# Patient Record
Sex: Female | Born: 1970
Health system: Southern US, Community
[De-identification: ages and names within clinical notes are randomized; demographics above are authoritative.]

## PROBLEM LIST (undated history)

## (undated) DIAGNOSIS — T7840XA Allergy, unspecified, initial encounter: Secondary | ICD-10-CM

## (undated) DIAGNOSIS — E039 Hypothyroidism, unspecified: Secondary | ICD-10-CM

## (undated) HISTORY — PX: NO PAST SURGERIES: SHX2092

## (undated) HISTORY — DX: Allergy, unspecified, initial encounter: T78.40XA

---

## 2015-04-16 ENCOUNTER — Other Ambulatory Visit: Payer: Self-pay | Admitting: Obstetrics and Gynecology

## 2015-04-16 DIAGNOSIS — R928 Other abnormal and inconclusive findings on diagnostic imaging of breast: Secondary | ICD-10-CM

## 2015-04-21 ENCOUNTER — Ambulatory Visit
Admission: RE | Admit: 2015-04-21 | Discharge: 2015-04-21 | Disposition: A | Discharge status: Home / Self Care | Payer: BLUE CROSS/BLUE SHIELD | Source: Ambulatory Visit | Attending: Obstetrics and Gynecology | Admitting: Obstetrics and Gynecology

## 2015-04-21 DIAGNOSIS — R928 Other abnormal and inconclusive findings on diagnostic imaging of breast: Secondary | ICD-10-CM

## 2016-04-27 ENCOUNTER — Other Ambulatory Visit: Payer: Self-pay | Admitting: Obstetrics and Gynecology

## 2016-04-27 DIAGNOSIS — R928 Other abnormal and inconclusive findings on diagnostic imaging of breast: Secondary | ICD-10-CM

## 2016-05-06 ENCOUNTER — Ambulatory Visit
Admission: RE | Admit: 2016-05-06 | Discharge: 2016-05-06 | Disposition: A | Discharge status: Home / Self Care | Payer: BLUE CROSS/BLUE SHIELD | Source: Ambulatory Visit | Attending: Obstetrics and Gynecology | Admitting: Obstetrics and Gynecology

## 2016-05-06 DIAGNOSIS — R928 Other abnormal and inconclusive findings on diagnostic imaging of breast: Secondary | ICD-10-CM

## 2016-11-04 ENCOUNTER — Other Ambulatory Visit: Payer: Self-pay | Admitting: Obstetrics and Gynecology

## 2016-11-04 DIAGNOSIS — N6489 Other specified disorders of breast: Secondary | ICD-10-CM

## 2016-11-11 ENCOUNTER — Ambulatory Visit
Admission: RE | Admit: 2016-11-11 | Discharge: 2016-11-11 | Disposition: A | Discharge status: Home / Self Care | Payer: No Typology Code available for payment source | Source: Ambulatory Visit | Attending: Obstetrics and Gynecology | Admitting: Obstetrics and Gynecology

## 2016-11-11 DIAGNOSIS — N6489 Other specified disorders of breast: Secondary | ICD-10-CM

## 2017-12-22 ENCOUNTER — Other Ambulatory Visit: Payer: Self-pay | Admitting: Obstetrics and Gynecology

## 2017-12-22 DIAGNOSIS — N6489 Other specified disorders of breast: Secondary | ICD-10-CM

## 2018-01-09 ENCOUNTER — Ambulatory Visit
Admission: RE | Admit: 2018-01-09 | Discharge: 2018-01-09 | Disposition: A | Discharge status: Home / Self Care | Payer: No Typology Code available for payment source | Source: Ambulatory Visit | Attending: Obstetrics and Gynecology | Admitting: Obstetrics and Gynecology

## 2018-01-09 DIAGNOSIS — N6489 Other specified disorders of breast: Secondary | ICD-10-CM

## 2019-07-18 ENCOUNTER — Telehealth: Payer: Self-pay | Admitting: Pharmacy Technician

## 2019-07-18 ENCOUNTER — Other Ambulatory Visit: Payer: Self-pay

## 2019-07-18 ENCOUNTER — Ambulatory Visit (INDEPENDENT_AMBULATORY_CARE_PROVIDER_SITE_OTHER): Payer: Self-pay | Admitting: *Deleted

## 2019-07-18 ENCOUNTER — Other Ambulatory Visit: Payer: Self-pay | Admitting: Pharmacist

## 2019-07-18 DIAGNOSIS — Z23 Encounter for immunization: Secondary | ICD-10-CM

## 2019-07-18 DIAGNOSIS — Z7251 High risk heterosexual behavior: Secondary | ICD-10-CM

## 2019-07-18 MED ORDER — EMTRICITABINE-TENOFOVIR DF 200-300 MG PO TABS: 1.0000 | ORAL_TABLET | Freq: Every day | ORAL | 2 refills | Status: DC

## 2019-07-18 MED FILL — TRUVADA 200-300 MG TABS: 200-300 | 30 days supply | Qty: 30 | Fill #0

## 2019-07-18 NOTE — Telephone Encounter (Addendum)
[facility] Patient Advocate Encounter   Patient has been approved for [employer] Advancing Access Patient Assistance Program for Truvada for 30days. This assistance will make the patient's copay $0.  I have spoken with the patient and they will pick up at [facility] after today's appointment.  The billing information is as follows:  Member ID: [id number] [place]: [id number] PCN: [id number] Group: [id number]  Patient knows to call the office with questions or concerns.  Bartholomew Crews, CPhT Specialty Pharmacy Patient [facility] Phone: [phone] Fax: [phone] 07/18/2019 3:56 PM

## 2019-07-18 NOTE — Progress Notes (Signed)
Patient came in with husband who is newly diagnosed HIV positive.  She will start on Truvada for PrEP and see me back in 1 month. Uninsured so [name] approval was obtained and she will pick up at [facility].

## 2019-07-19 NOTE — Telephone Encounter (Signed)
Patient has been approved to receive her medication at no charge for the enrollment period of 07/18/2019 to 11/01/2019. Same billing information as below.

## 2019-08-07 ENCOUNTER — Other Ambulatory Visit: Payer: Self-pay

## 2019-08-07 DIAGNOSIS — Z20822 Contact with and (suspected) exposure to covid-19: Secondary | ICD-10-CM

## 2019-08-09 LAB — NOVEL CORONAVIRUS, NAA: SARS-CoV-2, NAA: NOT DETECTED

## 2019-08-15 ENCOUNTER — Other Ambulatory Visit: Payer: Self-pay

## 2019-08-15 ENCOUNTER — Ambulatory Visit (INDEPENDENT_AMBULATORY_CARE_PROVIDER_SITE_OTHER): Payer: Self-pay | Admitting: Pharmacist

## 2019-08-15 DIAGNOSIS — Z7251 High risk heterosexual behavior: Secondary | ICD-10-CM

## 2019-08-16 MED FILL — TRUVADA 200-300 MG TABS: 200-300 | 30 days supply | Qty: 30 | Fill #1

## 2019-08-16 NOTE — Progress Notes (Signed)
Met with patient briefly today. She states that she is tolerating Truvada well and is having no issues.  Will tell [place] to mail her 2nd month to her today and see her back in 2 months for PrEP follow up and labs.

## 2019-09-12 MED FILL — TRUVADA 200-300 MG TABS: 200-300 | 30 days supply | Qty: 30 | Fill #2

## 2019-10-10 ENCOUNTER — Other Ambulatory Visit: Payer: Self-pay | Admitting: Pharmacist

## 2019-10-10 DIAGNOSIS — Z7251 High risk heterosexual behavior: Secondary | ICD-10-CM

## 2019-10-11 ENCOUNTER — Ambulatory Visit: Payer: No Typology Code available for payment source | Admitting: Pharmacist

## 2019-10-18 ENCOUNTER — Ambulatory Visit (INDEPENDENT_AMBULATORY_CARE_PROVIDER_SITE_OTHER): Payer: Self-pay | Admitting: Pharmacist

## 2019-10-18 ENCOUNTER — Other Ambulatory Visit: Payer: Self-pay | Admitting: Obstetrics and Gynecology

## 2019-10-18 ENCOUNTER — Other Ambulatory Visit: Payer: No Typology Code available for payment source

## 2019-10-18 ENCOUNTER — Other Ambulatory Visit: Payer: Self-pay

## 2019-10-18 DIAGNOSIS — N6489 Other specified disorders of breast: Secondary | ICD-10-CM

## 2019-10-18 DIAGNOSIS — Z7251 High risk heterosexual behavior: Secondary | ICD-10-CM

## 2019-10-18 MED ORDER — EMTRICITABINE-TENOFOVIR DF 200-300 MG PO TABS: 1.0000 | ORAL_TABLET | Freq: Every day | ORAL | 2 refills | Status: DC

## 2019-10-18 MED FILL — TRUVADA 200-300 MG TABS: 200-300 | 30 days supply | Qty: 30 | Fill #0

## 2019-10-18 NOTE — Progress Notes (Signed)
Date:  10/18/2019   HPI: Jocelyn Foster is a 48 y.o. female who presents to the [facility] clinic for 3 month PrEP follow-up.  Insured   []    Uninsured  [x]    There are no problems to display for this patient.   Patient's Medications  New Prescriptions   No medications on file  Previous Medications   No medications on file  Modified Medications   Modified Medication Previous Medication   EMTRICITABINE-TENOFOVIR (TRUVADA) 200-300 MG TABLET emtricitabine-tenofovir (TRUVADA) 200-300 MG tablet      Take 1 tablet by mouth daily.    Take 1 tablet by mouth daily.  Discontinued Medications   No medications on file    Allergies: Not on File  Past Medical History: No past medical history on file.  Social History: Social History   Socioeconomic History  . Marital status: Married    Spouse name: Not on file  . Number of children: Not on file  . Years of education: Not on file  . Highest education level: Not on file  Occupational History  . Not on file  Tobacco Use  . Smoking status: Not on file  Substance and Sexual Activity  . Alcohol use: Not on file  . Drug use: Not on file  . Sexual activity: Not on file  Other Topics Concern  . Not on file  Social History Narrative  . Not on file   Social Determinants of Health   Financial Resource Strain:   . Difficulty of Paying Living Expenses: Not on file  Food Insecurity:   . Worried About Charity fundraiser in the Last Year: Not on file  . Ran Out of Food in the Last Year: Not on file  Transportation Needs:   . Lack of Transportation (Medical): Not on file  . Lack of Transportation (Non-Medical): Not on file  Physical Activity:   . Days of Exercise per Week: Not on file  . Minutes of Exercise per Session: Not on file  Stress:   . Feeling of Stress : Not on file  Social Connections:   . Frequency of Communication with Friends and Family: Not on file  . Frequency of Social Gatherings with Friends and Family: Not  on file  . Attends Religious Services: Not on file  . Active Member of Clubs or Organizations: Not on file  . Attends Archivist Meetings: Not on file  . Marital Status: Not on file    No flowsheet data found.  Labs:  SCr: No results found for: CREATININE HIV No results found for: HIV Hepatitis B No results found for: HEPBSAB, HEPBSAG, HEPBCAB Hepatitis C No results found for: HEPCAB, HCVRNAPCRQN Hepatitis A No results found for: HAV RPR and STI No results found for: LABRPR, RPRTITER  No flowsheet data found.  Assessment: Jocelyn Foster is here today for PrEP follow up.  She is tolerating Truvada well and is having no issues taking it.  She states that she was nauseous when she first started it but then started taking it at night and her nausea has subsided. No missed doses.  Her husband is HIV positive on Biktarvy and his viral load is down to 115 from 364,000.  She states that they have not been sexually active. Will check labs today and see her back in 3 months.  She signed her [name] application to renew her assistance today.  Plan: - HIV antibody + BMET today - Truvada x 3 months if HIV negative - F/u  with me again 3/10 at 4pm  Jocelyn Foster L. Rashawn Rolon, PharmD, BCIDP, AAHIVP, CPP Infectious Diseases Clinical Pharmacist [facility] 10/18/2019, 4:00 PM

## 2019-10-19 ENCOUNTER — Encounter: Payer: Self-pay | Admitting: Pharmacist

## 2019-10-19 LAB — BASIC METABOLIC PANEL
BUN: 22 mg/dL (ref 7–25)
CO2: 26 mmol/L (ref 20–32)
Calcium: 9.1 mg/dL (ref 8.6–10.2)
Chloride: 103 mmol/L (ref 98–110)
Creat: 1.02 mg/dL (ref 0.50–1.10)
Glucose, Bld: 84 mg/dL (ref 65–99)
Potassium: 4.3 mmol/L (ref 3.5–5.3)
Sodium: 138 mmol/L (ref 135–146)

## 2019-10-19 LAB — HIV ANTIBODY (ROUTINE TESTING W REFLEX): HIV 1&2 Ab, 4th Generation: NONREACTIVE

## 2019-10-31 ENCOUNTER — Other Ambulatory Visit: Payer: Self-pay

## 2019-10-31 ENCOUNTER — Ambulatory Visit: Payer: No Typology Code available for payment source

## 2019-10-31 ENCOUNTER — Ambulatory Visit
Admission: RE | Admit: 2019-10-31 | Discharge: 2019-10-31 | Disposition: A | Discharge status: Home / Self Care | Payer: PRIVATE HEALTH INSURANCE | Source: Ambulatory Visit | Attending: Obstetrics and Gynecology | Admitting: Obstetrics and Gynecology

## 2019-10-31 DIAGNOSIS — N6489 Other specified disorders of breast: Secondary | ICD-10-CM

## 2019-11-12 ENCOUNTER — Telehealth: Payer: Self-pay | Admitting: Pharmacy Technician

## 2019-11-12 NOTE — Telephone Encounter (Signed)
[facility] Patient Advocate Encounter   Patient has a pending application for [employer] Advancing Access Patient Assistance Program for Truvada.    I have spoken with the patient and they provided income documents for the renewal application.  Patient knows to call the office with questions or concerns. Will follow-up with [employer] if we do not hear back after 3 days.  Beulah Gandy, CPhT Specialty Pharmacy Patient [facility] for [facility] Disease Phone: [phone] Fax: [phone] 11/12/2019 4:10 PM

## 2019-11-15 MED FILL — TRUVADA 200-300 MG TABS: 200-300 | 30 days supply | Qty: 30 | Fill #1

## 2019-11-15 NOTE — Telephone Encounter (Addendum)
[facility] Patient Advocate Encounter   Patient has been approved for [employer] Advancing Access Patient Assistance Program for Truvada. This assistance will make the patient's copay $0. 11/15/2019 to 10/31/2020  I have spoken with the patient and they will have it mailed from [facility].  The billing information is:  Member ID: [id number] RxBin: [id number] PCN: [id number] Group: [id number]  Patient knows to call the office with questions or concerns.  Beulah Gandy, CPhT Specialty Pharmacy Patient [facility] for Infectious Disease Phone: [phone] Fax: [phone] 11/15/2019 12:06 PM

## 2019-12-12 MED FILL — TRUVADA 200-300 MG TABS: 200-300 | 30 days supply | Qty: 30 | Fill #2

## 2020-01-09 ENCOUNTER — Ambulatory Visit: Payer: No Typology Code available for payment source | Admitting: Pharmacist

## 2020-01-16 ENCOUNTER — Ambulatory Visit: Payer: PRIVATE HEALTH INSURANCE | Admitting: Pharmacist

## 2020-01-17 ENCOUNTER — Other Ambulatory Visit: Payer: Self-pay

## 2020-01-17 ENCOUNTER — Ambulatory Visit (INDEPENDENT_AMBULATORY_CARE_PROVIDER_SITE_OTHER): Payer: PRIVATE HEALTH INSURANCE | Admitting: Pharmacist

## 2020-01-17 DIAGNOSIS — Z79899 Other long term (current) drug therapy: Secondary | ICD-10-CM

## 2020-01-18 ENCOUNTER — Other Ambulatory Visit: Payer: Self-pay

## 2020-01-18 ENCOUNTER — Other Ambulatory Visit: Payer: PRIVATE HEALTH INSURANCE

## 2020-01-18 DIAGNOSIS — Z7251 High risk heterosexual behavior: Secondary | ICD-10-CM

## 2020-01-18 NOTE — Progress Notes (Signed)
Virtual Visit via Telephone Note  I connected with Jocelyn Foster on 01/17/2020 at  1:45 PM EDT by telephone and verified that I am speaking with the correct person using two identifiers.  Location: Patient: home Provider: office   I discussed the limitations, risks, security and privacy concerns of performing an evaluation and management service by telephone and the availability of in person appointments. I also discussed with the patient that there may be a patient responsible charge related to this service. The patient expressed understanding and agreed to proceed.    Date:  01/18/2020   HPI: Jocelyn Foster is a 49 y.o. female who presents to the [facility] clinic for 3 month PrEP follow-up.  Insured   []    Uninsured  [x]    There are no problems to display for this patient.   Patient's Medications  New Prescriptions   No medications on file  Previous Medications   EMTRICITABINE-TENOFOVIR (TRUVADA) 200-300 MG TABLET    Take 1 tablet by mouth daily.  Modified Medications   No medications on file  Discontinued Medications   No medications on file    Allergies: Not on File  Past Medical History: No past medical history on file.  Social History: Social History   Socioeconomic History  . Marital status: Married    Spouse name: Not on file  . Number of children: Not on file  . Years of education: Not on file  . Highest education level: Not on file  Occupational History  . Not on file  Tobacco Use  . Smoking status: Not on file  Substance and Sexual Activity  . Alcohol use: Not on file  . Drug use: Not on file  . Sexual activity: Not on file  Other Topics Concern  . Not on file  Social History Narrative  . Not on file   Social Determinants of Health   Financial Resource Strain:   . Difficulty of Paying Living Expenses:   Food Insecurity:   . Worried About in the Last Year:   . in the Last Year:     Transportation Needs:   . Programme researcher, broadcasting/film/video (Medical):   Barista Lack of Transportation (Non-Medical):   Physical Activity:   . Days of Exercise per Week:   . Minutes of Exercise per Session:   Stress:   . Feeling of Stress :   Social Connections:   . Frequency of Communication with Friends and Family:   . Frequency of Social Gatherings with Friends and Family:   . Attends Religious Services:   . Active Member of Clubs or Organizations:   . Attends Freight forwarder Meetings:   Marland Kitchen Marital Status:     No flowsheet data found.  Labs:  SCr: Lab Results  Component Value Date   CREATININE 1.02 10/18/2019   HIV Lab Results  Component Value Date   HIV NON-REACTIVE 10/18/2019   Hepatitis B No results found for: HEPBSAB, HEPBSAG, HEPBCAB Hepatitis C No results found for: HEPCAB, HCVRNAPCRQN Hepatitis A No results found for: HAV RPR and STI No results found for: LABRPR, RPRTITER  No flowsheet data found.  Assessment: Patient requested that I call her today to discuss PrEP.  She had several questions regarding the need to take PrEP. He husband was just diagnosed with HIV several months ago and is now taking Biktarvy. I explained PrEP again to her and the need to take it.  I explained the need  for her husband to take his medications everyday and the U=U campaign from the [facility].  I also explained what PrEP is and how it works. Answered all questions.    She would like to see our counselor to talk about how she is coping with her husband's diagnosis. Will hook her up with Marcie Bal. She will come in tomorrow for a HIV antibody, and I will send in Truvada refills if negative. She states that she doesn't miss any doses currently and has a few tablets left.  Plan: - HIV antibody - Truvada refills if negative - F/u in 3 months  I discussed the assessment and treatment plan with the patient. The patient was provided an opportunity to ask questions and all were answered. The patient  agreed with the plan and demonstrated an understanding of the instructions.   The patient was advised to call back or seek an in-person evaluation if the symptoms worsen or if the condition fails to improve as anticipated.  I provided 25 minutes of non-face-to-face time during this encounter.   Deandrew Hoecker L. Eldo Umanzor, PharmD, BCIDP, [place], CPP Clinical Pharmacist Practitioner [facility] 01/18/2020, 3:17 PM

## 2020-01-19 LAB — BASIC METABOLIC PANEL
BUN: 21 mg/dL (ref 7–25)
CO2: 26 mmol/L (ref 20–32)
Calcium: 8.9 mg/dL (ref 8.6–10.2)
Chloride: 108 mmol/L (ref 98–110)
Creat: 0.87 mg/dL (ref 0.50–1.10)
Glucose, Bld: 98 mg/dL (ref 65–99)
Potassium: 4.5 mmol/L (ref 3.5–5.3)
Sodium: 140 mmol/L (ref 135–146)

## 2020-01-19 LAB — HIV ANTIBODY (ROUTINE TESTING W REFLEX): HIV 1&2 Ab, 4th Generation: NONREACTIVE

## 2020-01-21 ENCOUNTER — Other Ambulatory Visit: Payer: Self-pay | Admitting: Pharmacist

## 2020-01-21 DIAGNOSIS — Z7251 High risk heterosexual behavior: Secondary | ICD-10-CM

## 2020-01-21 MED ORDER — EMTRICITABINE-TENOFOVIR DF 200-300 MG PO TABS: 1.0000 | ORAL_TABLET | Freq: Every day | ORAL | 2 refills | Status: DC

## 2020-01-21 MED FILL — TRUVADA 200-300 MG TABS: 200-300 | 30 days supply | Qty: 30 | Fill #0

## 2020-01-21 NOTE — Progress Notes (Signed)
Patient's HIV antibody is negative.  Will send in 3 more months of Truvada to [facility].  

## 2020-02-13 MED FILL — TRUVADA 200-300 MG TABS: 200-300 | 30 days supply | Qty: 30 | Fill #1

## 2020-03-12 ENCOUNTER — Encounter: Payer: Self-pay | Admitting: Pharmacist

## 2020-04-24 ENCOUNTER — Encounter: Payer: Self-pay | Admitting: Pharmacist

## 2020-04-28 ENCOUNTER — Other Ambulatory Visit: Payer: Self-pay

## 2020-04-28 ENCOUNTER — Other Ambulatory Visit: Payer: Self-pay | Admitting: Pharmacist

## 2020-04-28 DIAGNOSIS — Z79899 Other long term (current) drug therapy: Secondary | ICD-10-CM

## 2020-04-29 ENCOUNTER — Other Ambulatory Visit: Payer: Self-pay | Admitting: Pharmacist

## 2020-04-29 ENCOUNTER — Encounter: Payer: Self-pay | Admitting: Pharmacist

## 2020-04-29 DIAGNOSIS — Z7251 High risk heterosexual behavior: Secondary | ICD-10-CM

## 2020-04-29 LAB — HIV ANTIBODY (ROUTINE TESTING W REFLEX): HIV 1&2 Ab, 4th Generation: NONREACTIVE

## 2020-04-29 MED ORDER — EMTRICITABINE-TENOFOVIR DF 200-300 MG PO TABS: 1.0000 | ORAL_TABLET | Freq: Every day | ORAL | 2 refills | Status: DC

## 2020-04-29 MED FILL — TRUVADA 200-300 MG TABS: 200-300 | 30 days supply | Qty: 30 | Fill #0

## 2020-04-29 NOTE — Progress Notes (Signed)
Patient's HIV antibody is negative.  Will send in 3 more months of Truvada to [facility].  

## 2020-05-21 MED FILL — TRUVADA 200-300 MG TABS: 200-300 | 30 days supply | Qty: 30 | Fill #1

## 2020-06-18 MED FILL — TRUVADA 200-300 MG TABS: 200-300 | 30 days supply | Qty: 30 | Fill #2

## 2020-07-31 ENCOUNTER — Encounter: Payer: Self-pay | Admitting: Pharmacist

## 2020-08-04 ENCOUNTER — Other Ambulatory Visit: Payer: Self-pay | Admitting: Pharmacist

## 2020-08-04 DIAGNOSIS — Z79899 Other long term (current) drug therapy: Secondary | ICD-10-CM

## 2020-08-04 NOTE — Progress Notes (Signed)
Labs for PrEP

## 2020-08-05 ENCOUNTER — Other Ambulatory Visit: Payer: PRIVATE HEALTH INSURANCE

## 2020-08-05 ENCOUNTER — Other Ambulatory Visit: Payer: Self-pay

## 2020-08-05 DIAGNOSIS — Z79899 Other long term (current) drug therapy: Secondary | ICD-10-CM

## 2020-08-06 ENCOUNTER — Other Ambulatory Visit: Payer: Self-pay | Admitting: Pharmacist

## 2020-08-06 ENCOUNTER — Other Ambulatory Visit (HOSPITAL_COMMUNITY): Payer: Self-pay | Admitting: Pharmacist

## 2020-08-06 DIAGNOSIS — Z7251 High risk heterosexual behavior: Secondary | ICD-10-CM

## 2020-08-06 LAB — BASIC METABOLIC PANEL
BUN: 22 mg/dL (ref 7–25)
CO2: 24 mmol/L (ref 20–32)
Calcium: 9.5 mg/dL (ref 8.6–10.2)
Chloride: 103 mmol/L (ref 98–110)
Creat: 0.96 mg/dL (ref 0.50–1.10)
Glucose, Bld: 87 mg/dL (ref 65–99)
Potassium: 4.2 mmol/L (ref 3.5–5.3)
Sodium: 137 mmol/L (ref 135–146)

## 2020-08-06 LAB — HIV ANTIBODY (ROUTINE TESTING W REFLEX): HIV 1&2 Ab, 4th Generation: NONREACTIVE

## 2020-08-06 MED ORDER — EMTRICITABINE-TENOFOVIR DF 200-300 MG PO TABS: 1.0000 | ORAL_TABLET | Freq: Every day | ORAL | 2 refills | Status: DC

## 2020-08-06 NOTE — Progress Notes (Signed)
Patient's HIV antibody is negative.  Will send in 3 more months of Truvada to [facility].  

## 2020-08-08 MED FILL — TRUVADA 200-300 MG TABS: 200-300 | 30 days supply | Qty: 30 | Fill #0

## 2020-09-04 MED FILL — TRUVADA 200-300 MG TABS: 200-300 | 30 days supply | Qty: 30 | Fill #1

## 2020-09-30 MED FILL — TRUVADA 200-300 MG TABS: 200-300 | 30 days supply | Qty: 30 | Fill #2

## 2020-11-12 ENCOUNTER — Encounter: Payer: Self-pay | Admitting: Pharmacist

## 2020-11-13 ENCOUNTER — Other Ambulatory Visit: Payer: Self-pay | Admitting: Pharmacist

## 2020-11-13 DIAGNOSIS — Z7251 High risk heterosexual behavior: Secondary | ICD-10-CM

## 2020-11-14 ENCOUNTER — Other Ambulatory Visit: Payer: Self-pay

## 2020-11-14 ENCOUNTER — Other Ambulatory Visit: Payer: PRIVATE HEALTH INSURANCE

## 2020-11-14 DIAGNOSIS — Z7251 High risk heterosexual behavior: Secondary | ICD-10-CM

## 2020-11-17 ENCOUNTER — Other Ambulatory Visit: Payer: Self-pay | Admitting: Pharmacist

## 2020-11-17 ENCOUNTER — Encounter: Payer: Self-pay | Admitting: Pharmacist

## 2020-11-17 ENCOUNTER — Other Ambulatory Visit (HOSPITAL_COMMUNITY): Payer: Self-pay | Admitting: Internal Medicine

## 2020-11-17 DIAGNOSIS — Z7251 High risk heterosexual behavior: Secondary | ICD-10-CM

## 2020-11-17 LAB — HIV ANTIBODY (ROUTINE TESTING W REFLEX): HIV 1&2 Ab, 4th Generation: NONREACTIVE

## 2020-11-17 MED ORDER — EMTRICITABINE-TENOFOVIR DF 200-300 MG PO TABS: 1.0000 | ORAL_TABLET | Freq: Every day | ORAL | 2 refills | Status: DC

## 2020-11-21 ENCOUNTER — Telehealth: Payer: Self-pay

## 2020-11-21 NOTE — Telephone Encounter (Signed)
[facility] Patient Advocate Encounter  Completed and sent [employer] Advancing Access application for Truvada for this patient who is uninsured.    Patient assistance phone number for follow up is [phone].   This encounter will be updated until final determination.,   Brandan Glauber, CPhT Specialty Pharmacy Patient [facility] Phone: [phone] Fax:  [phone]  

## 2020-11-24 MED FILL — TRUVADA 200-300 MG TABS: 200-300 | 30 days supply | Qty: 30 | Fill #0

## 2020-11-27 ENCOUNTER — Telehealth: Payer: Self-pay

## 2020-11-27 NOTE — Telephone Encounter (Signed)
[facility] Patient Advocate Encounter  Completed and sent [employer] Advancing Access application for Truvada for this patient who is uninsured.    Patient is approved 11/26/20 through 10/31/21.  BIN      [id number] PCN    [id number] [id number]    [id number] ID        [id number]   Clearance Coots, CPhT Specialty Pharmacy Patient [facility] for Infectious Disease Phone: [phone] Fax:  [phone]

## 2020-12-18 MED FILL — TRUVADA 200-300 MG TABS: 200-300 | 30 days supply | Qty: 30 | Fill #1

## 2021-01-27 ENCOUNTER — Other Ambulatory Visit (HOSPITAL_COMMUNITY): Payer: Self-pay

## 2021-02-13 ENCOUNTER — Other Ambulatory Visit (HOSPITAL_COMMUNITY): Payer: Self-pay

## 2021-02-13 ENCOUNTER — Other Ambulatory Visit: Payer: Self-pay | Admitting: Internal Medicine

## 2021-02-16 ENCOUNTER — Other Ambulatory Visit (HOSPITAL_COMMUNITY): Payer: Self-pay

## 2021-02-16 NOTE — Telephone Encounter (Signed)
Scheduled tomorrow for an appointment

## 2021-02-17 ENCOUNTER — Other Ambulatory Visit (HOSPITAL_COMMUNITY): Payer: Self-pay

## 2021-02-17 ENCOUNTER — Encounter: Payer: Self-pay | Admitting: Family

## 2021-02-17 ENCOUNTER — Other Ambulatory Visit: Payer: Self-pay

## 2021-02-17 ENCOUNTER — Ambulatory Visit (INDEPENDENT_AMBULATORY_CARE_PROVIDER_SITE_OTHER): Payer: PRIVATE HEALTH INSURANCE | Admitting: Family

## 2021-02-17 DIAGNOSIS — Z7251 High risk heterosexual behavior: Secondary | ICD-10-CM | POA: Diagnosis not present

## 2021-02-17 MED ORDER — EMTRICITABINE-TENOFOVIR DF 200-300 MG PO TABS
1.0000 | ORAL_TABLET | Freq: Every day | ORAL | 2 refills | Status: DC
  Filled 2021-02-17 – 2021-02-18 (×3): qty 30, 30d supply, fill #0
  Filled 2021-03-13: qty 30, 30d supply, fill #1
  Filled 2021-04-15: qty 30, 30d supply, fill #2

## 2021-02-17 NOTE — Progress Notes (Signed)
Subjective:    Patient ID: Jocelyn Foster, female    DOB: November 16, 1970, 50 y.o.   MRN: [id number]  Chief Complaint  Patient presents with  . Follow-up    PREP     HPI:  Jocelyn Foster is a 50 y.o. female on preexposure prophylaxis for HIV and last seen on 11/17/2020 with negative HIV testing and good tolerance of Truvada.  Here today for routine follow-up.  Jocelyn Foster continues to take her Truvada daily as prescribed with no significant adverse side effects.  Questions if having some upset stomach and the first initial doses is normal.  Overall feeling well today with no new concerns/complaints. Denies fevers, chills, night sweats, headaches, changes in vision, neck pain/stiffness, nausea, diarrhea, vomiting, lesions or rashes.  Jocelyn Foster has no problems obtaining medication from the pharmacy and has not delivered.   Allergies  Allergen Reactions  . Erythromycin Nausea And Vomiting      Outpatient Medications Prior to Visit  Medication Sig Dispense Refill  . emtricitabine-tenofovir (TRUVADA) 200-300 MG tablet Take 1 tablet by mouth daily. 30 tablet 2  . emtricitabine-tenofovir (TRUVADA) 200-300 MG tablet TAKE 1 TABLET BY MOUTH DAILY. (Patient not taking: Reported on 02/17/2021) 30 tablet 2  . emtricitabine-tenofovir (TRUVADA) 200-300 MG tablet TAKE 1 TABLET BY MOUTH DAILY. (Patient not taking: Reported on 02/17/2021) 30 tablet 2   No facility-administered medications prior to visit.     History reviewed. No pertinent past medical history.   History reviewed. No pertinent surgical history.     Review of Systems  Constitutional: Negative for appetite change, chills, diaphoresis, fatigue, fever and unexpected weight change.  Eyes:       Negative for acute change in vision  Respiratory: Negative for chest tightness, shortness of breath and wheezing.   Cardiovascular: Negative for chest pain.  Gastrointestinal: Negative for diarrhea, nausea and vomiting.   Genitourinary: Negative for dysuria, pelvic pain and vaginal discharge.  Musculoskeletal: Negative for neck pain and neck stiffness.  Skin: Negative for rash.  Neurological: Negative for seizures, syncope, weakness and headaches.  Hematological: Negative for adenopathy. Does not bruise/bleed easily.  Psychiatric/Behavioral: Negative for hallucinations.      Objective:    BP 124/85   Pulse 88   Temp 98.5 F (36.9 C) (Oral)   Wt 185 lb (83.9 kg)  Nursing note and vital signs reviewed.  Physical Exam Constitutional:      General: She is not in acute distress.    Appearance: She is well-developed.  Eyes:     Conjunctiva/sclera: Conjunctivae normal.  Cardiovascular:     Rate and Rhythm: Normal rate and regular rhythm.     Heart sounds: Normal heart sounds. No murmur heard. No friction rub. No gallop.   Pulmonary:     Effort: Pulmonary effort is normal. No respiratory distress.     Breath sounds: Normal breath sounds. No wheezing or rales.  Chest:     Chest wall: No tenderness.  Abdominal:     General: Bowel sounds are normal.     Palpations: Abdomen is soft.     Tenderness: There is no abdominal tenderness.  Musculoskeletal:     Cervical back: Neck supple.  Lymphadenopathy:     Cervical: No cervical adenopathy.  Skin:    General: Skin is warm and dry.     Findings: No rash.  Neurological:     Mental Status: She is alert and oriented to person, place, and time.  Psychiatric:  Behavior: Behavior normal.        Thought Content: Thought content normal.        Judgment: Judgment normal.      No flowsheet data found.     Assessment & Plan:    Patient Active Problem List   Diagnosis Date Noted  . High risk sexual behavior 02/17/2021     Problem List Items Addressed This Visit      Other   High risk sexual behavior    Ms. Asbill continues to have good tolerance to Truvada for HIV pre-exposure prophylaxis. Check HIV and BMET. Continue current dose of  Truvada. Discussed importance of safe sexual practice to reduce risk of STI.       Relevant Medications   emtricitabine-tenofovir (TRUVADA) 200-300 MG tablet   Other Relevant Orders   HIV Antibody (routine testing w rflx)   Basic metabolic panel       I am having Jocelyn Foster maintain her emtricitabine-tenofovir.   Meds ordered this encounter  Medications  . emtricitabine-tenofovir (TRUVADA) 200-300 MG tablet    Sig: Take 1 tablet by mouth daily.    Dispense:  30 tablet    Refill:  2    For PrEP    Order Specific Question:   Supervising Provider    Answer:   Judyann Munson [[id number]]     Follow-up: Return in about 3 months (around 05/19/2021), or if symptoms worsen or fail to improve.   Marcos Eke, MSN, FNP-C Nurse Practitioner [facility] for Infectious Disease [facility] Main number: [phone]

## 2021-02-17 NOTE — Patient Instructions (Signed)
Nice to meet you.  We will check lab work today.   Continue to take your Truvada as prescribed.   Refills have been sent to the pharmacy.  Plan for follow up in 3 months or sooner if needed.  Have a great day and stay safe!

## 2021-02-17 NOTE — Assessment & Plan Note (Signed)
Jocelyn Foster continues to have good tolerance to Truvada for HIV pre-exposure prophylaxis. Check HIV and BMET. Continue current dose of Truvada. Discussed importance of safe sexual practice to reduce risk of STI.

## 2021-02-18 ENCOUNTER — Other Ambulatory Visit (HOSPITAL_COMMUNITY): Payer: Self-pay

## 2021-02-18 LAB — BASIC METABOLIC PANEL
Calcium: 9.5 mg/dL (ref 8.6–10.2)
Creat: 0.81 mg/dL (ref 0.50–1.10)
Glucose, Bld: 89 mg/dL (ref 65–99)
Sodium: 138 mmol/L (ref 135–146)

## 2021-02-19 LAB — BASIC METABOLIC PANEL
BUN: 21 mg/dL (ref 7–25)
CO2: 29 mmol/L (ref 20–32)
Chloride: 102 mmol/L (ref 98–110)
Potassium: 4.3 mmol/L (ref 3.5–5.3)

## 2021-02-19 LAB — HIV ANTIBODY (ROUTINE TESTING W REFLEX): HIV 1&2 Ab, 4th Generation: NONREACTIVE

## 2021-03-13 ENCOUNTER — Other Ambulatory Visit (HOSPITAL_COMMUNITY): Payer: Self-pay

## 2021-03-16 ENCOUNTER — Other Ambulatory Visit (HOSPITAL_COMMUNITY): Payer: Self-pay

## 2021-04-13 ENCOUNTER — Other Ambulatory Visit (HOSPITAL_COMMUNITY): Payer: Self-pay

## 2021-04-15 ENCOUNTER — Other Ambulatory Visit (HOSPITAL_COMMUNITY): Payer: Self-pay

## 2021-04-16 ENCOUNTER — Other Ambulatory Visit (HOSPITAL_COMMUNITY): Payer: Self-pay

## 2021-05-13 ENCOUNTER — Other Ambulatory Visit (HOSPITAL_COMMUNITY): Payer: Self-pay

## 2021-05-13 ENCOUNTER — Other Ambulatory Visit: Payer: Self-pay | Admitting: Family

## 2021-05-13 DIAGNOSIS — Z7251 High risk heterosexual behavior: Secondary | ICD-10-CM

## 2021-05-14 ENCOUNTER — Other Ambulatory Visit (HOSPITAL_COMMUNITY): Payer: Self-pay

## 2021-05-18 ENCOUNTER — Other Ambulatory Visit (HOSPITAL_COMMUNITY): Payer: Self-pay

## 2021-05-20 ENCOUNTER — Other Ambulatory Visit: Payer: Self-pay | Admitting: Pharmacist

## 2021-05-20 ENCOUNTER — Other Ambulatory Visit: Payer: PRIVATE HEALTH INSURANCE

## 2021-05-20 ENCOUNTER — Other Ambulatory Visit: Payer: Self-pay

## 2021-05-20 DIAGNOSIS — Z79899 Other long term (current) drug therapy: Secondary | ICD-10-CM

## 2021-05-21 ENCOUNTER — Other Ambulatory Visit: Payer: Self-pay | Admitting: Pharmacist

## 2021-05-21 ENCOUNTER — Other Ambulatory Visit (HOSPITAL_COMMUNITY): Payer: Self-pay

## 2021-05-21 ENCOUNTER — Other Ambulatory Visit: Payer: Self-pay | Admitting: Family

## 2021-05-21 DIAGNOSIS — Z7251 High risk heterosexual behavior: Secondary | ICD-10-CM

## 2021-05-21 LAB — HIV ANTIBODY (ROUTINE TESTING W REFLEX): HIV 1&2 Ab, 4th Generation: NONREACTIVE

## 2021-05-21 MED ORDER — EMTRICITABINE-TENOFOVIR DF 200-300 MG PO TABS
1.0000 | ORAL_TABLET | Freq: Every day | ORAL | 2 refills | Status: DC
  Filled 2021-05-21: qty 30, 30d supply, fill #0
  Filled 2021-06-17: qty 30, 30d supply, fill #1
  Filled 2021-07-15: qty 30, 30d supply, fill #2

## 2021-06-15 ENCOUNTER — Other Ambulatory Visit (HOSPITAL_COMMUNITY): Payer: Self-pay

## 2021-06-17 ENCOUNTER — Other Ambulatory Visit (HOSPITAL_COMMUNITY): Payer: Self-pay

## 2021-07-15 ENCOUNTER — Other Ambulatory Visit (HOSPITAL_COMMUNITY): Payer: Self-pay

## 2021-07-20 ENCOUNTER — Other Ambulatory Visit (HOSPITAL_COMMUNITY): Payer: Self-pay

## 2021-08-17 ENCOUNTER — Other Ambulatory Visit (HOSPITAL_COMMUNITY): Payer: Self-pay

## 2021-08-17 ENCOUNTER — Other Ambulatory Visit: Payer: Self-pay | Admitting: Family

## 2021-08-17 ENCOUNTER — Other Ambulatory Visit: Payer: Self-pay | Admitting: Obstetrics and Gynecology

## 2021-08-17 DIAGNOSIS — Z7251 High risk heterosexual behavior: Secondary | ICD-10-CM

## 2021-08-20 ENCOUNTER — Ambulatory Visit (INDEPENDENT_AMBULATORY_CARE_PROVIDER_SITE_OTHER): Payer: PRIVATE HEALTH INSURANCE | Admitting: Pharmacist

## 2021-08-20 ENCOUNTER — Other Ambulatory Visit: Payer: Self-pay

## 2021-08-20 DIAGNOSIS — Z79899 Other long term (current) drug therapy: Secondary | ICD-10-CM

## 2021-08-20 NOTE — Progress Notes (Signed)
   Date:  08/20/2021   HPI: Jocelyn Foster is a 50 y.o. female who presents to the [facility] pharmacy clinic for HIV PrEP follow-up.  Insured   [x]    Uninsured  []    Patient Active Problem List   Diagnosis Date Noted   High risk sexual behavior 02/17/2021    Patient's Medications  New Prescriptions   No medications on file  Previous Medications   EMTRICITABINE-TENOFOVIR (TRUVADA) 200-300 MG TABLET    Take 1 tablet by mouth daily.  Modified Medications   No medications on file  Discontinued Medications   No medications on file    Allergies: Allergies  Allergen Reactions   Erythromycin Nausea And Vomiting    Past Medical History: No past medical history on file.  Social History: Social History   Socioeconomic History   Marital status: Married    Spouse name: Not on file   Number of children: Not on file   Years of education: Not on file   Highest education level: Not on file  Occupational History   Not on file  Tobacco Use   Smoking status: Former    Types: Cigarettes   Smokeless tobacco: Never  Substance and Sexual Activity   Alcohol use: Yes    Comment: weekly   Drug use: Never   Sexual activity: Not on file  Other Topics Concern   Not on file  Social History Narrative   Not on file   Social Determinants of Health   Financial Resource Strain: Not on file  Food Insecurity: Not on file  Transportation Needs: Not on file  Physical Activity: Not on file  Stress: Not on file  Social Connections: Not on file    No flowsheet data found.  Labs:  SCr: Lab Results  Component Value Date   CREATININE 0.81 02/17/2021   CREATININE 0.96 08/05/2020   CREATININE 0.87 01/18/2020   CREATININE 1.02 10/18/2019   HIV Lab Results  Component Value Date   HIV NON-REACTIVE 05/20/2021   HIV NON-REACTIVE 02/17/2021   HIV NON-REACTIVE 11/14/2020   HIV NON-REACTIVE 08/05/2020   HIV NON-REACTIVE 04/28/2020   Hepatitis B No results found for: HEPBSAB,  HEPBSAG, HEPBCAB Hepatitis C No results found for: HEPCAB, HCVRNAPCRQN Hepatitis A No results found for: HAV RPR and STI No results found for: LABRPR, RPRTITER  No flowsheet data found.  Assessment: Jocelyn Foster is here today to follow up for PrEP. She takes Truvada every day and denies any missed doses. Her husband is HIV positive on Biktarvy. She still struggles with his diagnosis and sees a therapist. She wishes her husband would join her. Since she is low risk, will push out to every 6 month follow ups. All questions answered. She knows to reach out if she needs anything before her next appointment.   Plan: - HIV antibody + BMET today - Truvada x 3 months if HIV negative - F/u in April  Jocelyn Foster, PharmD, BCIDP, AAHIVP, CPP Clinical Pharmacist Practitioner Infectious Diseases Clinical Pharmacist [facility] 08/20/2021, 11:37 AM

## 2021-08-21 ENCOUNTER — Other Ambulatory Visit: Payer: Self-pay | Admitting: Pharmacist

## 2021-08-21 ENCOUNTER — Other Ambulatory Visit (HOSPITAL_COMMUNITY): Payer: Self-pay

## 2021-08-21 DIAGNOSIS — Z7251 High risk heterosexual behavior: Secondary | ICD-10-CM

## 2021-08-21 LAB — BASIC METABOLIC PANEL
BUN/Creatinine Ratio: 38 (calc) — ABNORMAL HIGH (ref 6–22)
BUN: 30 mg/dL — ABNORMAL HIGH (ref 7–25)
CO2: 26 mmol/L (ref 20–32)
Calcium: 9.1 mg/dL (ref 8.6–10.2)
Chloride: 103 mmol/L (ref 98–110)
Creat: 0.8 mg/dL (ref 0.50–0.99)
Glucose, Bld: 81 mg/dL (ref 65–99)
Potassium: 4.7 mmol/L (ref 3.5–5.3)
Sodium: 135 mmol/L (ref 135–146)

## 2021-08-21 LAB — HIV ANTIBODY (ROUTINE TESTING W REFLEX): HIV 1&2 Ab, 4th Generation: NONREACTIVE

## 2021-08-21 MED ORDER — EMTRICITABINE-TENOFOVIR DF 200-300 MG PO TABS
1.0000 | ORAL_TABLET | Freq: Every day | ORAL | 5 refills | Status: DC
  Filled 2021-08-21: qty 30, 30d supply, fill #0
  Filled 2021-09-14: qty 30, 30d supply, fill #1
  Filled 2021-10-12: qty 30, 30d supply, fill #2
  Filled 2021-11-06 – 2021-11-23 (×2): qty 30, 30d supply, fill #3
  Filled 2021-12-18: qty 30, 30d supply, fill #4
  Filled 2022-01-11: qty 30, 30d supply, fill #5

## 2021-08-21 NOTE — Progress Notes (Signed)
Patient's HIV antibody is negative.  Will send in 6 more months of Truvada to [facility].

## 2021-08-26 ENCOUNTER — Other Ambulatory Visit: Payer: Self-pay | Admitting: Obstetrics and Gynecology

## 2021-08-26 DIAGNOSIS — R928 Other abnormal and inconclusive findings on diagnostic imaging of breast: Secondary | ICD-10-CM

## 2021-09-09 ENCOUNTER — Ambulatory Visit: Payer: PRIVATE HEALTH INSURANCE

## 2021-09-09 ENCOUNTER — Ambulatory Visit
Admission: RE | Admit: 2021-09-09 | Discharge: 2021-09-09 | Disposition: A | Discharge status: Home / Self Care | Payer: PRIVATE HEALTH INSURANCE | Source: Ambulatory Visit | Attending: Obstetrics and Gynecology | Admitting: Obstetrics and Gynecology

## 2021-09-09 DIAGNOSIS — R928 Other abnormal and inconclusive findings on diagnostic imaging of breast: Secondary | ICD-10-CM

## 2021-09-14 ENCOUNTER — Other Ambulatory Visit (HOSPITAL_COMMUNITY): Payer: Self-pay

## 2021-09-17 ENCOUNTER — Other Ambulatory Visit (HOSPITAL_COMMUNITY): Payer: Self-pay

## 2021-10-05 ENCOUNTER — Other Ambulatory Visit (HOSPITAL_COMMUNITY): Payer: Self-pay

## 2021-10-12 ENCOUNTER — Other Ambulatory Visit (HOSPITAL_COMMUNITY): Payer: Self-pay

## 2021-10-14 ENCOUNTER — Other Ambulatory Visit (HOSPITAL_COMMUNITY): Payer: Self-pay

## 2021-10-19 ENCOUNTER — Encounter (HOSPITAL_BASED_OUTPATIENT_CLINIC_OR_DEPARTMENT_OTHER): Payer: Self-pay | Admitting: Emergency Medicine

## 2021-10-19 ENCOUNTER — Other Ambulatory Visit: Payer: Self-pay

## 2021-10-19 ENCOUNTER — Other Ambulatory Visit (HOSPITAL_BASED_OUTPATIENT_CLINIC_OR_DEPARTMENT_OTHER): Payer: Self-pay

## 2021-10-19 ENCOUNTER — Emergency Department (HOSPITAL_BASED_OUTPATIENT_CLINIC_OR_DEPARTMENT_OTHER)
Admission: EM | Admit: 2021-10-19 | Discharge: 2021-10-19 | Disposition: A | Discharge status: Home / Self Care | Payer: PRIVATE HEALTH INSURANCE | Source: Home / Self Care | Attending: Emergency Medicine | Admitting: Emergency Medicine

## 2021-10-19 DIAGNOSIS — L03114 Cellulitis of left upper limb: Secondary | ICD-10-CM | POA: Insufficient documentation

## 2021-10-19 DIAGNOSIS — L03012 Cellulitis of left finger: Secondary | ICD-10-CM | POA: Diagnosis not present

## 2021-10-19 DIAGNOSIS — S61412A Laceration without foreign body of left hand, initial encounter: Secondary | ICD-10-CM | POA: Diagnosis not present

## 2021-10-19 DIAGNOSIS — Z87891 Personal history of nicotine dependence: Secondary | ICD-10-CM | POA: Insufficient documentation

## 2021-10-19 DIAGNOSIS — E039 Hypothyroidism, unspecified: Secondary | ICD-10-CM | POA: Insufficient documentation

## 2021-10-19 DIAGNOSIS — L0390 Cellulitis, unspecified: Secondary | ICD-10-CM

## 2021-10-19 HISTORY — DX: Hypothyroidism, unspecified: E03.9

## 2021-10-19 LAB — BASIC METABOLIC PANEL
Anion gap: 6 (ref 5–15)
BUN: 15 mg/dL (ref 6–20)
CO2: 29 mmol/L (ref 22–32)
Calcium: 8.9 mg/dL (ref 8.9–10.3)
Chloride: 100 mmol/L (ref 98–111)
Creatinine, Ser: 0.91 mg/dL (ref 0.44–1.00)
GFR, Estimated: >60 mL/min (ref >60)
Glucose, Bld: 113 mg/dL — ABNORMAL HIGH (ref 70–99)
Potassium: 3.6 mmol/L (ref 3.5–5.1)
Sodium: 135 mmol/L (ref 135–145)

## 2021-10-19 LAB — CBC
HCT: 42.8 % (ref 36.0–46.0)
Hemoglobin: 14.4 g/dL (ref 12.0–15.0)
MCH: 30.8 pg (ref 26.0–34.0)
MCHC: 33.6 g/dL (ref 30.0–36.0)
MCV: 91.5 fL (ref 80.0–100.0)
Platelets: 148 10*3/uL — ABNORMAL LOW (ref 150–400)
RBC: 4.68 MIL/uL (ref 3.87–5.11)
RDW: 13.2 % (ref 11.5–15.5)
WBC: 6.2 10*3/uL (ref 4.0–10.5)
nRBC: 0 % (ref 0.0–0.2)

## 2021-10-19 MED ORDER — VANCOMYCIN HCL IN DEXTROSE 1-5 GM/200ML-% IV SOLN
1000.0000 mg | Freq: Once | INTRAVENOUS | Status: AC
  Administered 2021-10-19: 11:00:00 1000 mg via INTRAVENOUS
  Filled 2021-10-19: qty 200

## 2021-10-19 MED ORDER — VANCOMYCIN HCL 1250 MG/250ML IV SOLN
1250.0000 mg | INTRAVENOUS | Status: DC
  Filled 2021-10-19: qty 250

## 2021-10-19 MED ORDER — DOXYCYCLINE HYCLATE 100 MG PO TABS
100.0000 mg | ORAL_TABLET | Freq: Two times a day (BID) | ORAL | 0 refills | Status: DC
  Filled 2021-10-19: qty 20, 10d supply, fill #0

## 2021-10-19 MED ORDER — LIDOCAINE HCL (PF) 1 % IJ SOLN
5.0000 mL | Freq: Once | INTRAMUSCULAR | Status: AC
  Administered 2021-10-19: 11:00:00 5 mL
  Filled 2021-10-19: qty 5

## 2021-10-19 NOTE — ED Triage Notes (Addendum)
Pt arrives to ED with c/o left hand cellulitis. Pt reports that she cut her left hand x1 week ago. Pt reports she has been to [facility] yesterday due to increased swelling, redness, and pain in the hand and the provider dx her with cellulitis and started her on Keflex 500mg . Today she noticed a rash running up on her left arm. Pt does report she was around a child with impetigo last week.

## 2021-10-19 NOTE — ED Provider Notes (Signed)
[facility] EMERGENCY DEPT Provider Note   CSN: [id number] Arrival date & time: 10/19/21  8101     History Chief Complaint  Patient presents with   Cellulitis    Jocelyn Foster is a 50 y.o. female.  HPI  Patient presents to the ED with complaints of worsening wound infection her left hand.  Patient states she cut her hand over a week ago on a can.  She did not think too much of it.  She was around her granddaughter who was later diagnosed with impetigo.  She is not sure if that could be related.  Patient started noticing redness and swelling around the wound in the last day or 2.  She has felt feverish.  She went to an urgent care and they attempted to perform an I&D and started on Keflex.  Today the patient noticed a rash that was running up her arm.  She denies any numbness or weakness.  Past Medical History:  Diagnosis Date   Hypothyroidism     Patient Active Problem List   Diagnosis Date Noted   High risk sexual behavior 02/17/2021    History reviewed. No pertinent surgical history.   OB History   No obstetric history on file.     Family History  Problem Relation Age of Onset   Breast cancer Sister 78    Social History   Tobacco Use   Smoking status: Former    Types: Cigarettes   Smokeless tobacco: Never  Substance Use Topics   Alcohol use: Yes    Comment: weekly   Drug use: Never    Home Medications Prior to Admission medications   Medication Sig Start Date End Date Taking? Authorizing Provider  doxycycline (VIBRA-TABS) 100 MG tablet Take 1 tablet (100 mg total) by mouth 2 (two) times daily for 10 days. 10/19/21 10/29/21 Yes Linwood Dibbles, MD  emtricitabine-tenofovir (TRUVADA) 200-300 MG tablet Take 1 tablet by mouth daily. 08/21/21   Kuppelweiser, Cassie L, RPH-CPP    Allergies    Erythromycin  Review of Systems   Review of Systems  All other systems reviewed and are negative.  Physical Exam Updated Vital Signs BP 119/77     Pulse 90    Temp 98 F (36.7 C) (Oral)    Resp 17    Ht 1.651 m (5\' 5" )    Wt 83 kg    SpO2 100%    BMI 30.45 kg/m   Physical Exam Vitals and nursing note reviewed.  Constitutional:      General: She is not in acute distress.    Appearance: She is well-developed.  HENT:     Head: Normocephalic and atraumatic.     Right Ear: External ear normal.     Left Ear: External ear normal.  Eyes:     General: No scleral icterus.       Right eye: No discharge.        Left eye: No discharge.     Conjunctiva/sclera: Conjunctivae normal.  Neck:     Trachea: No tracheal deviation.  Cardiovascular:     Rate and Rhythm: Normal rate and regular rhythm.  Pulmonary:     Effort: Pulmonary effort is normal. No respiratory distress.     Breath sounds: Normal breath sounds. No stridor.  Abdominal:     General: There is no distension.  Musculoskeletal:        General: Swelling and tenderness present. No deformity.     Cervical back: Neck supple.  Comments: Small white area in the volar aspect of the thenar eminence with question of fluctuance, indurated and tender, erythematous  Skin:    General: Skin is warm and dry.     Findings: No rash.     Comments: Erythematous rash extending up the forearm and upper arm, consistent with lymphangitic streaking  Neurological:     Mental Status: She is alert.     Cranial Nerves: Cranial nerve deficit: no gross deficits.       ED Results / Procedures / Treatments   Labs (all labs ordered are listed, but only abnormal results are displayed) Labs Reviewed  CBC - Abnormal; Notable for the following components:      Result Value   Platelets 148 (*)    All other components within normal limits  BASIC METABOLIC PANEL - Abnormal; Notable for the following components:   Glucose, Bld 113 (*)    All other components within normal limits  AEROBIC CULTURE W GRAM STAIN (SUPERFICIAL SPECIMEN)    EKG None  Radiology No results  found.  Procedures .Marland KitchenIncision and Drainage  Date/Time: 10/19/2021 10:57 AM Performed by: Linwood Dibbles, MD Authorized by: Linwood Dibbles, MD   Consent:    Consent obtained:  Verbal   Consent given by:  Patient   Risks discussed:  Bleeding, incomplete drainage, pain and damage to other organs   Alternatives discussed:  No treatment Universal protocol:    Procedure explained and questions answered to patient or proxy's satisfaction: yes     Relevant documents present and verified: yes     Test results available : yes     Imaging studies available: yes     Required blood products, implants, devices, and special equipment available: yes     Site/side marked: yes     Immediately prior to procedure, a time out was called: yes     Patient identity confirmed:  Verbally with patient Location:    Type:  Abscess   Location: Left hand. Pre-procedure details:    Skin preparation:  Betadine Sedation:    Sedation type:  None Anesthesia:    Anesthesia method:  Local infiltration   Local anesthetic:  Lidocaine 1% w/o epi Procedure type:    Complexity:  Simple Procedure details:    Incision types:  Single straight   Incision depth:  Subcutaneous   Wound management:  Probed and deloculated   Drainage:  Bloody   Drainage amount:  Scant   Packing materials:  None Post-procedure details:    Procedure completion:  Tolerated well, no immediate complications   Medications Ordered in ED Medications  vancomycin (VANCOREADY) IVPB 1250 mg/250 mL (has no administration in time range)  lidocaine (PF) (XYLOCAINE) 1 % injection 5 mL (5 mLs Infiltration Given by Other 10/19/21 1057)  vancomycin (VANCOCIN) IVPB 1000 mg/200 mL premix (0 mg Intravenous Stopped 10/19/21 1228)    ED Course  I have reviewed the triage vital signs and the nursing notes.  Pertinent labs & imaging results that were available during my care of the patient were reviewed by me and considered in my medical decision making (see  chart for details).    MDM Rules/Calculators/A&P                         I&D procedure performed.  No significant purulent drainage.  Exam is consistent with cellulitis and lymphangitis.  I doubt that the rash is related to a drug reaction.  Patient's laboratory tests are reassuring.  She has been on Keflex for 1 day and its not any better and may be a little bit worse.  At this time I do not suspect severe deep space infection in the hand.  Discussed options with the patient being admitted to the hospital for IV antibiotics versus returning to the ER tomorrow for recheck.  She is otherwise healthy and no systemic medical conditions that I think the latter is reasonable.  Patient would prefer this option as well.  She was given a dose of vancomycin in the ED.  I will add on doxycycline.  Patient will return tomorrow to be rechecked unless his symptoms are significantly better    Final Clinical Impression(s) / ED Diagnoses Final diagnoses:  Cellulitis, unspecified cellulitis site    Rx / DC Orders ED Discharge Orders          Ordered    doxycycline (VIBRA-TABS) 100 MG tablet  2 times daily        10/19/21 1244             Linwood Dibbles, MD 10/19/21 1246

## 2021-10-19 NOTE — Discharge Instructions (Signed)
Start taking the doxycycline in addition to the Keflex.  Return to the emergency room tomorrow to be rechecked if the symptoms are not significantly better

## 2021-10-19 NOTE — ED Notes (Signed)
Patient verbalizes understanding of discharge instructions. Opportunity for questioning and answers were provided. Patient discharged from ED.  °

## 2021-10-19 NOTE — Progress Notes (Signed)
Pharmacy Antibiotic Note  Jocelyn Foster is Jocelyn 50 y.o. female admitted on 10/19/2021 with cellulitis.  Pharmacy has been consulted for vancomycin dosing.  Plan: Vancomycin 1250 IV every 24 hours.   Goal - AUC ~ 500 mcg*h/mL  Height: 5\' 5"  (165.1 cm) Weight: 83 kg (183 lb) IBW/kg (Calculated) : 57  Temp (24hrs), Avg:98 F (36.7 C), Min:98 F (36.7 C), Max:98 F (36.7 C)  Recent Labs  Lab 10/19/21 0943  WBC 6.2  CREATININE 0.91    Estimated Creatinine Clearance: 78.7 mL/min (by C-G formula based on SCr of 0.91 mg/dL).    Allergies  Allergen Reactions   Erythromycin Nausea And Vomiting    Antimicrobials this admission: Vancomycin 1 GM IV x 1  Microbiology results: pending  Thank you for allowing pharmacy to be Jocelyn part of this patients care.  10/21/21 Jocelyn Foster 10/19/2021 11:07 AM

## 2021-10-20 ENCOUNTER — Inpatient Hospital Stay (HOSPITAL_BASED_OUTPATIENT_CLINIC_OR_DEPARTMENT_OTHER)
Admission: EM | Admit: 2021-10-20 | Discharge: 2021-10-25 | DRG: 605 | Disposition: A | Discharge status: Home / Self Care | Payer: PRIVATE HEALTH INSURANCE | Attending: Internal Medicine | Admitting: Internal Medicine

## 2021-10-20 ENCOUNTER — Encounter (HOSPITAL_BASED_OUTPATIENT_CLINIC_OR_DEPARTMENT_OTHER): Payer: Self-pay | Admitting: Emergency Medicine

## 2021-10-20 ENCOUNTER — Other Ambulatory Visit: Payer: Self-pay

## 2021-10-20 DIAGNOSIS — M609 Myositis, unspecified: Secondary | ICD-10-CM | POA: Diagnosis present

## 2021-10-20 DIAGNOSIS — Z23 Encounter for immunization: Secondary | ICD-10-CM

## 2021-10-20 DIAGNOSIS — R110 Nausea: Secondary | ICD-10-CM | POA: Diagnosis present

## 2021-10-20 DIAGNOSIS — Z6830 Body mass index (BMI) 30.0-30.9, adult: Secondary | ICD-10-CM | POA: Diagnosis not present

## 2021-10-20 DIAGNOSIS — Z20822 Contact with and (suspected) exposure to covid-19: Secondary | ICD-10-CM | POA: Diagnosis present

## 2021-10-20 DIAGNOSIS — L03012 Cellulitis of left finger: Secondary | ICD-10-CM | POA: Diagnosis present

## 2021-10-20 DIAGNOSIS — Z206 Contact with and (suspected) exposure to human immunodeficiency virus [HIV]: Secondary | ICD-10-CM | POA: Diagnosis present

## 2021-10-20 DIAGNOSIS — G44209 Tension-type headache, unspecified, not intractable: Secondary | ICD-10-CM | POA: Diagnosis present

## 2021-10-20 DIAGNOSIS — S61412A Laceration without foreign body of left hand, initial encounter: Secondary | ICD-10-CM | POA: Diagnosis present

## 2021-10-20 DIAGNOSIS — A46 Erysipelas: Secondary | ICD-10-CM | POA: Diagnosis present

## 2021-10-20 DIAGNOSIS — W268XXA Contact with other sharp object(s), not elsewhere classified, initial encounter: Secondary | ICD-10-CM | POA: Diagnosis not present

## 2021-10-20 DIAGNOSIS — I891 Lymphangitis: Secondary | ICD-10-CM | POA: Diagnosis not present

## 2021-10-20 DIAGNOSIS — L0103 Bullous impetigo: Secondary | ICD-10-CM | POA: Diagnosis present

## 2021-10-20 DIAGNOSIS — L089 Local infection of the skin and subcutaneous tissue, unspecified: Secondary | ICD-10-CM | POA: Diagnosis not present

## 2021-10-20 DIAGNOSIS — Z888 Allergy status to other drugs, medicaments and biological substances status: Secondary | ICD-10-CM | POA: Diagnosis not present

## 2021-10-20 DIAGNOSIS — L03119 Cellulitis of unspecified part of limb: Secondary | ICD-10-CM | POA: Diagnosis present

## 2021-10-20 DIAGNOSIS — Z79899 Other long term (current) drug therapy: Secondary | ICD-10-CM

## 2021-10-20 DIAGNOSIS — L0101 Non-bullous impetigo: Secondary | ICD-10-CM | POA: Diagnosis not present

## 2021-10-20 DIAGNOSIS — Y929 Unspecified place or not applicable: Secondary | ICD-10-CM | POA: Diagnosis not present

## 2021-10-20 DIAGNOSIS — L03114 Cellulitis of left upper limb: Secondary | ICD-10-CM

## 2021-10-20 DIAGNOSIS — E039 Hypothyroidism, unspecified: Secondary | ICD-10-CM | POA: Diagnosis present

## 2021-10-20 DIAGNOSIS — Z87891 Personal history of nicotine dependence: Secondary | ICD-10-CM | POA: Diagnosis not present

## 2021-10-20 DIAGNOSIS — E669 Obesity, unspecified: Secondary | ICD-10-CM | POA: Diagnosis present

## 2021-10-20 LAB — BASIC METABOLIC PANEL
Anion gap: 8 (ref 5–15)
BUN: 15 mg/dL (ref 6–20)
CO2: 26 mmol/L (ref 22–32)
Calcium: 8.3 mg/dL — ABNORMAL LOW (ref 8.9–10.3)
Chloride: 102 mmol/L (ref 98–111)
Creatinine, Ser: 0.82 mg/dL (ref 0.44–1.00)
GFR, Estimated: >60 mL/min (ref >60)
Glucose, Bld: 106 mg/dL — ABNORMAL HIGH (ref 70–99)
Potassium: 3.6 mmol/L (ref 3.5–5.1)
Sodium: 136 mmol/L (ref 135–145)

## 2021-10-20 LAB — RESP PANEL BY RT-PCR (FLU A&B, COVID) ARPGX2
Influenza A by PCR: NEGATIVE
Influenza B by PCR: NEGATIVE
SARS Coronavirus 2 by RT PCR: NEGATIVE

## 2021-10-20 LAB — CBC WITH DIFFERENTIAL/PLATELET
Abs Immature Granulocytes: 0.01 10*3/uL (ref 0.00–0.07)
Basophils Absolute: 0.1 10*3/uL (ref 0.0–0.1)
Basophils Relative: 1 %
Eosinophils Absolute: 0.2 10*3/uL (ref 0.0–0.5)
Eosinophils Relative: 3 %
HCT: 40 % (ref 36.0–46.0)
Hemoglobin: 13.4 g/dL (ref 12.0–15.0)
Immature Granulocytes: 0 %
Lymphocytes Relative: 44 %
Lymphs Abs: 2.5 10*3/uL (ref 0.7–4.0)
MCH: 30.5 pg (ref 26.0–34.0)
MCHC: 33.5 g/dL (ref 30.0–36.0)
MCV: 91.1 fL (ref 80.0–100.0)
Monocytes Absolute: 0.4 10*3/uL (ref 0.1–1.0)
Monocytes Relative: 7 %
Neutro Abs: 2.6 10*3/uL (ref 1.7–7.7)
Neutrophils Relative %: 45 %
Platelets: 154 10*3/uL (ref 150–400)
RBC: 4.39 MIL/uL (ref 3.87–5.11)
RDW: 13 % (ref 11.5–15.5)
WBC: 5.8 10*3/uL (ref 4.0–10.5)
nRBC: 0 % (ref 0.0–0.2)

## 2021-10-20 LAB — SEDIMENTATION RATE: Sed Rate: 18 mm/hr (ref 0–22)

## 2021-10-20 LAB — LACTIC ACID, PLASMA: Lactic Acid, Venous: 0.8 mmol/L (ref 0.5–1.9)

## 2021-10-20 LAB — C-REACTIVE PROTEIN: CRP: 6.9 mg/dL — ABNORMAL HIGH (ref <1.0)

## 2021-10-20 MED ORDER — VANCOMYCIN HCL 1250 MG/250ML IV SOLN
1250.0000 mg | INTRAVENOUS | Status: DC
  Administered 2021-10-21 – 2021-10-22 (×2): 1250 mg via INTRAVENOUS
  Filled 2021-10-20 (×4): qty 250

## 2021-10-20 MED ORDER — TETANUS-DIPHTH-ACELL PERTUSSIS 5-2.5-18.5 LF-MCG/0.5 IM SUSY
0.5000 mL | PREFILLED_SYRINGE | Freq: Once | INTRAMUSCULAR | Status: AC
  Administered 2021-10-20: 12:00:00 0.5 mL via INTRAMUSCULAR
  Filled 2021-10-20: qty 0.5

## 2021-10-20 MED ORDER — ONDANSETRON HCL 4 MG PO TABS
4.0000 mg | ORAL_TABLET | Freq: Four times a day (QID) | ORAL | Status: DC | PRN
  Administered 2021-10-21: 15:00:00 4 mg via ORAL
  Filled 2021-10-20: qty 1

## 2021-10-20 MED ORDER — THYROID 30 MG PO TABS
30.0000 mg | ORAL_TABLET | Freq: Every morning | ORAL | Status: DC
  Administered 2021-10-21 – 2021-10-25 (×5): 30 mg via ORAL
  Filled 2021-10-20 (×5): qty 1

## 2021-10-20 MED ORDER — VANCOMYCIN HCL 1250 MG/250ML IV SOLN
1250.0000 mg | INTRAVENOUS | Status: DC
  Filled 2021-10-20: qty 250

## 2021-10-20 MED ORDER — ACETAMINOPHEN 325 MG PO TABS
650.0000 mg | ORAL_TABLET | Freq: Four times a day (QID) | ORAL | Status: DC | PRN
  Administered 2021-10-20 – 2021-10-24 (×3): 650 mg via ORAL
  Filled 2021-10-20 (×4): qty 2

## 2021-10-20 MED ORDER — EMTRICITABINE-TENOFOVIR AF 200-25 MG PO TABS
1.0000 | ORAL_TABLET | Freq: Every day | ORAL | Status: DC
  Administered 2021-10-20 – 2021-10-21 (×2): 1 via ORAL
  Filled 2021-10-20 (×2): qty 1

## 2021-10-20 MED ORDER — VANCOMYCIN HCL IN DEXTROSE 1-5 GM/200ML-% IV SOLN
1000.0000 mg | INTRAVENOUS | Status: AC
  Administered 2021-10-20: 13:00:00 1000 mg via INTRAVENOUS
  Filled 2021-10-20: qty 200

## 2021-10-20 MED ORDER — LACTATED RINGERS IV SOLN
INTRAVENOUS | Status: DC
Start: 2021-10-20 — End: 2021-10-20

## 2021-10-20 MED ORDER — BACID PO TABS
2.0000 | ORAL_TABLET | Freq: Three times a day (TID) | ORAL | Status: DC
  Administered 2021-10-20: 22:00:00 2 via ORAL
  Filled 2021-10-20 (×2): qty 2

## 2021-10-20 MED ORDER — LIOTHYRONINE SODIUM 5 MCG PO TABS
5.0000 ug | ORAL_TABLET | Freq: Every day | ORAL | Status: DC
  Administered 2021-10-21: 09:00:00 10 ug via ORAL
  Filled 2021-10-20: qty 2

## 2021-10-20 MED ORDER — ACETAMINOPHEN 650 MG RE SUPP: 650.0000 mg | Freq: Four times a day (QID) | RECTAL | Status: DC | PRN

## 2021-10-20 MED ORDER — KETOROLAC TROMETHAMINE 30 MG/ML IJ SOLN
30.0000 mg | Freq: Four times a day (QID) | INTRAMUSCULAR | Status: DC | PRN
  Administered 2021-10-21 (×3): 30 mg via INTRAVENOUS
  Filled 2021-10-20 (×4): qty 1

## 2021-10-20 MED ORDER — LACTATED RINGERS IV BOLUS
1000.0000 mL | Freq: Once | INTRAVENOUS | Status: AC
  Administered 2021-10-20: 12:00:00 1000 mL via INTRAVENOUS

## 2021-10-20 MED ORDER — CEFAZOLIN SODIUM-DEXTROSE 1-4 GM/50ML-% IV SOLN
1.0000 g | Freq: Three times a day (TID) | INTRAVENOUS | Status: DC
  Administered 2021-10-20 – 2021-10-21 (×2): 1 g via INTRAVENOUS
  Filled 2021-10-20 (×3): qty 50

## 2021-10-20 MED ORDER — ONDANSETRON HCL 4 MG/2ML IJ SOLN
4.0000 mg | Freq: Four times a day (QID) | INTRAMUSCULAR | Status: DC | PRN
  Administered 2021-10-21 – 2021-10-24 (×6): 4 mg via INTRAVENOUS
  Filled 2021-10-20 (×7): qty 2

## 2021-10-20 NOTE — H&P (Addendum)
History and Physical    Jocelyn Foster QIH:[id number] DOB: 10-Dec-1970 DOA: 10/20/2021  PCP: Pcp, No (Confirm with patient/family/NH records and if not entered, this has to be entered at [facility] point of entry) Patient coming from: Home  I have personally briefly reviewed patient's old medical records in [facility]  Chief Complaint: Left hand  HPI: Jocelyn Foster is a 50 y.o. female with medical history significant of chronic HIV preexposure prophylaxis with Truvada, hypothyroidism, presented with worsening of left hand swelling and pain.  Patient sustained a small cut on the left great thenar area about 7 days ago when she was opening a can, then she started to develop right swelling of the left hand about 4 days ago.  3 days ago, she went to urgent care, when she had hand x-ray and had a incision and drainage of the left hand and she was told very little pus coming out.  She was discharged with Keflex.  Over the weekend, the left hand swelling and pain persisted and 2 days ago, she developed a progressive new rash in her arm, with pain and warmness.  She feels the left thumb very stiff but no loss the feeling of left thumb. She also feels feverish and chills.  Yesterday, she came back to the ED again had another drainage, cultures pending.  ED Course: Afebrile, blood work showed WBC 5.8, patient was started on vancomycin ED.  Review of Systems: As per HPI otherwise 14 point review of systems negative.    Past Medical History:  Diagnosis Date   Hypothyroidism     History reviewed. No pertinent surgical history.   reports that she has quit smoking. Her smoking use included cigarettes. She has never used smokeless tobacco. She reports current alcohol use. She reports that she does not use drugs.  Allergies  Allergen Reactions   Erythromycin Nausea And Vomiting    Family History  Problem Relation Age of Onset   Breast cancer Sister 65     Prior to Admission  medications   Medication Sig Start Date End Date Taking? Authorizing Provider  ARMOUR THYROID 30 MG tablet Take 30 mg by mouth every morning. 10/07/21  Yes [provider]  cephALEXin (KEFLEX) 500 MG capsule Take 500 mg by mouth in the morning, at noon, in the evening, and at bedtime. 10/18/21 10/28/21 Yes [provider]  doxycycline (VIBRA-TABS) 100 MG tablet Take 1 tablet (100 mg total) by mouth 2 (two) times daily for 10 days. 10/19/21 10/29/21 Yes Linwood Dibbles, MD  emtricitabine-tenofovir (TRUVADA) 200-300 MG tablet Take 1 tablet by mouth daily. 08/21/21  Yes Kuppelweiser, Cassie L, RPH-CPP  liothyronine (CYTOMEL) 5 MCG tablet Take 5-10 mcg by mouth daily. 10/09/21  Yes [provider]    Physical Exam: Vitals:   10/20/21 1415 10/20/21 1416 10/20/21 1700 10/20/21 1827  BP:   114/75 119/79  Pulse:   91 87  Resp:   18 20  Temp:  98.1 F (36.7 C)  98.9 F (37.2 C)  TempSrc:  Oral  Oral  SpO2:   97% 100%  Weight: 83 kg     Height: 5\' 5"  (1.651 m)       Constitutional: NAD, calm, comfortable Vitals:   10/20/21 1415 10/20/21 1416 10/20/21 1700 10/20/21 1827  BP:   114/75 119/79  Pulse:   91 87  Resp:   18 20  Temp:  98.1 F (36.7 C)  98.9 F (37.2 C)  TempSrc:  Oral  Oral  SpO2:   97% 100%  Weight: 83 kg     Height: 5\' 5"  (1.651 m)      Eyes: PERRL, lids and conjunctivae normal ENMT: Mucous membranes are moist. Posterior pharynx clear of any exudate or lesions.Normal dentition.  Neck: normal, supple, no masses, no thyromegaly Respiratory: clear to auscultation bilaterally, no wheezing, no crackles. Normal respiratory effort. No accessory muscle use.  Cardiovascular: Regular rate and rhythm, no murmurs / rubs / gallops. No extremity edema. 2+ pedal pulses. No carotid bruits.  Abdomen: no tenderness, no masses palpated. No hepatosplenomegaly. Bowel sounds positive.  Musculoskeletal: no clubbing / cyanosis. No joint deformity upper and lower extremities.  Good ROM, no contractures. Normal muscle tone.  Skin: Rash along the medial side left arm to the mid upper arm, left center. Left hand, great thenar swelling and tender, with feeling of undulating underneath and flexion of left thumb decrease.  Left thumb capillary refill brisk. Neurologic: CN 2-12 grossly intact. Sensation intact, DTR normal. Strength 5/5 in all 4.  Psychiatric: Normal judgment and insight. Alert and oriented x 3. Normal mood.        Labs on Admission: I have personally reviewed following labs and imaging studies  CBC: Recent Labs  Lab 10/19/21 0943 10/20/21 1130  WBC 6.2 5.8  NEUTROABS  --  2.6  HGB 14.4 13.4  HCT 42.8 40.0  MCV 91.5 91.1  PLT 148* 154   Basic Metabolic Panel: Recent Labs  Lab 10/19/21 0943 10/20/21 1130  NA 135 136  K 3.6 3.6  CL 100 102  CO2 29 26  GLUCOSE 113* 106*  BUN 15 15  CREATININE 0.91 0.82  CALCIUM 8.9 8.3*   GFR: Estimated Creatinine Clearance: 87.3 mL/min (by C-G formula based on SCr of 0.82 mg/dL). Liver Function Tests: No results for input(s): AST, ALT, ALKPHOS, BILITOT, PROT, ALBUMIN in the last 168 hours. No results for input(s): LIPASE, AMYLASE in the last 168 hours. No results for input(s): AMMONIA in the last 168 hours. Coagulation Profile: No results for input(s): INR, PROTIME in the last 168 hours. Cardiac Enzymes: No results for input(s): CKTOTAL, CKMB, CKMBINDEX, TROPONINI in the last 168 hours. BNP (last 3 results) No results for input(s): PROBNP in the last 8760 hours. HbA1C: No results for input(s): HGBA1C in the last 72 hours. CBG: No results for input(s): GLUCAP in the last 168 hours. Lipid Profile: No results for input(s): CHOL, HDL, LDLCALC, TRIG, CHOLHDL, LDLDIRECT in the last 72 hours. Thyroid Function Tests: No results for input(s): TSH, T4TOTAL, FREET4, T3FREE, THYROIDAB in the last 72 hours. Anemia Panel: No results for input(s): VITAMINB12, FOLATE, FERRITIN, TIBC, IRON, RETICCTPCT in  the last 72 hours. Urine analysis: No results found for: COLORURINE, APPEARANCEUR, LABSPEC, PHURINE, GLUCOSEU, HGBUR, BILIRUBINUR, KETONESUR, PROTEINUR, UROBILINOGEN, NITRITE, LEUKOCYTESUR  Radiological Exams on Admission: No results found.  EKG: None  Assessment/Plan Principal Problem:   Cellulitis of arm  (please populate well all problems here in Problem List. (For example, if patient is on BP meds at home and you resume or decide to hold them, it is a problem that needs to be her. Same for CAD, COPD, HLD and so on)  Left hand abscess -Status post incision and drainage x2 -With local physical exam showing undulation, tenderness and limited left bowel movement, suspect underlying abscess/synovial tendinitis. Will check left hand MRI and depends on the result, will consult hand surgery. -Continue Vanco and add Ancef.  Erysipelas of left arm -Suspecting lymph spreading of the left hand  infection -Check ASO -On Ancef.  HIV prophylaxis -Continue Truvada  Hypothyroid -Continue Synthroid.   DVT prophylaxis: SCD Code Status: Full code Family Communication: Husband at bedside Disposition Plan: expect more than 2 midnight hospital stay and expect hand surgery intervention during hospital stay. Consults called: None Admission status: Medsurg admit   Emeline General MD [facility] Pager [phone]  10/20/2021, 7:20 PM   Addendum  Patient takes Truvada for high risks sexual behavior as per PCP on the note on 02/17/2021.

## 2021-10-20 NOTE — ED Notes (Signed)
Care Handoff/Report given to [facility] RN at this Time. All Questions answered.

## 2021-10-20 NOTE — ED Provider Notes (Signed)
[facility] EMERGENCY DEPT Provider Note   CSN: [id number] Arrival date & time: 10/20/21  [id number]     History Chief Complaint  Patient presents with   Cellulitis    Jocelyn Foster is a 51 y.o. female.  HPI Patient presents for worsening cellulitis.  Cellulitis is located on her left upper extremity.  She previously sustained a laceration from a can of food on the thenar eminence of her left hand.  This was approximately 2 weeks ago.  She was seen in urgent care over the weekend.  She underwent incision and drainage of the area of the laceration.  She was started on Keflex.  She came to the ED yesterday and underwent an additional I&D.  She was given a dose of vancomycin and discharged on doxycycline.  She was advised to continue taking the Keflex.  This puts her on day 3 of Keflex and day 2 of doxycycline.  Despite these antibiotics, she has had worsening spread of her cellulitis.  Area of warmth, erythema, and induration now extends into her axilla.  She has had subjective fever.    Past Medical History:  Diagnosis Date   Hypothyroidism     Patient Active Problem List   Diagnosis Date Noted   Cellulitis of arm 10/20/2021   Cellulitis of finger of left hand 10/20/2021   High risk sexual behavior 02/17/2021    History reviewed. No pertinent surgical history.   OB History   No obstetric history on file.     Family History  Problem Relation Age of Onset   Breast cancer Sister 46    Social History   Tobacco Use   Smoking status: Former    Types: Cigarettes   Smokeless tobacco: Never  Substance Use Topics   Alcohol use: Yes    Comment: weekly   Drug use: Never    Home Medications Prior to Admission medications   Medication Sig Start Date End Date Taking? Authorizing Provider  ARMOUR THYROID 30 MG tablet Take 30 mg by mouth every morning. 10/07/21  Yes [provider]  cephALEXin (KEFLEX) 500 MG capsule Take 500 mg by mouth in the  morning, at noon, in the evening, and at bedtime. 10/18/21 10/28/21 Yes [provider]  doxycycline (VIBRA-TABS) 100 MG tablet Take 1 tablet (100 mg total) by mouth 2 (two) times daily for 10 days. 10/19/21 10/29/21 Yes Dorie Rank, MD  emtricitabine-tenofovir (TRUVADA) 200-300 MG tablet Take 1 tablet by mouth daily. 08/21/21  Yes Kuppelweiser, Cassie L, RPH-CPP  liothyronine (CYTOMEL) 5 MCG tablet Take 5-10 mcg by mouth daily. 10/09/21  Yes [provider]    Allergies    Erythromycin  Review of Systems   Review of Systems  Constitutional:  Positive for fever. Negative for activity change, appetite change, chills and fatigue.  HENT:  Negative for congestion, ear pain and sore throat.   Eyes:  Negative for pain and visual disturbance.  Respiratory:  Negative for cough, chest tightness and shortness of breath.   Cardiovascular:  Negative for chest pain and palpitations.  Gastrointestinal:  Negative for abdominal pain, diarrhea, nausea and vomiting.  Genitourinary:  Negative for dysuria, flank pain, hematuria and pelvic pain.  Musculoskeletal:  Negative for arthralgias, back pain, myalgias and neck pain.  Skin:  Positive for color change and wound.  Allergic/Immunologic: Negative for immunocompromised state.  Neurological:  Negative for dizziness, seizures, syncope, weakness, light-headedness, numbness and headaches.  Psychiatric/Behavioral:  Negative for confusion and decreased concentration.   All other  systems reviewed and are negative.  Physical Exam Updated Vital Signs BP (!) 144/87 (BP Location: Right Arm)    Pulse 99    Temp 98 F (36.7 C) (Oral)    Resp 17    Ht 5\' 5"  (1.651 m)    Wt 83 kg    SpO2 100%    BMI 30.45 kg/m   Physical Exam Vitals and nursing note reviewed.  Constitutional:      General: She is not in acute distress.    Appearance: Normal appearance. She is well-developed and normal weight. She is not ill-appearing, toxic-appearing or diaphoretic.   HENT:     Head: Normocephalic and atraumatic.     Right Ear: External ear normal.     Left Ear: External ear normal.     Nose: Nose normal.     Mouth/Throat:     Mouth: Mucous membranes are moist.     Pharynx: Oropharynx is clear.  Eyes:     General: No scleral icterus.    Extraocular Movements: Extraocular movements intact.     Conjunctiva/sclera: Conjunctivae normal.  Cardiovascular:     Rate and Rhythm: Regular rhythm. Tachycardia present.     Heart sounds: No murmur heard. Pulmonary:     Effort: Pulmonary effort is normal. No respiratory distress.     Breath sounds: Normal breath sounds. No wheezing or rales.  Abdominal:     General: Abdomen is flat.     Palpations: Abdomen is soft.     Tenderness: There is no abdominal tenderness.  Musculoskeletal:        General: No swelling. Normal range of motion.     Cervical back: Normal range of motion and neck supple. No rigidity.     Right lower leg: No edema.     Left lower leg: No edema.  Skin:    General: Skin is warm and dry.     Capillary Refill: Capillary refill takes less than 2 seconds.     Coloration: Skin is not jaundiced or pale.     Findings: Erythema present.  Neurological:     General: No focal deficit present.     Mental Status: She is alert and oriented to person, place, and time.     Cranial Nerves: No cranial nerve deficit.     Sensory: No sensory deficit.     Motor: No weakness.     Coordination: Coordination normal.  Psychiatric:        Mood and Affect: Mood normal.        Behavior: Behavior normal.        Thought Content: Thought content normal.        Judgment: Judgment normal.       ED Results / Procedures / Treatments   Labs (all labs ordered are listed, but only abnormal results are displayed) Labs Reviewed  BASIC METABOLIC PANEL - Abnormal; Notable for the following components:      Result Value   Glucose, Bld 106 (*)    Calcium 8.3 (*)    All other components within normal limits   C-REACTIVE PROTEIN - Abnormal; Notable for the following components:   CRP 6.9 (*)    All other components within normal limits  CULTURE, BLOOD (ROUTINE X 2)  RESP PANEL BY RT-PCR (FLU A&B, COVID) ARPGX2  CBC WITH DIFFERENTIAL/PLATELET  LACTIC ACID, PLASMA  SEDIMENTATION RATE  ANTISTREPTOLYSIN O TITER    EKG None  Radiology MR HAND LEFT W WO CONTRAST  Result Date: 10/21/2021 CLINICAL DATA:  Left hand swelling and pain, possible osteomyelitis. Laceration along the thenar region 7 days prior to imaging. Incision and drainage 3 days ago. Fever and chills. Chronic HIV. EXAM: MRI OF THE LEFT HAND WITHOUT AND WITH CONTRAST TECHNIQUE: Multiplanar, multisequence MR imaging of the left hand was performed before and after the administration of intravenous contrast. CONTRAST:  61mL GADAVIST GADOBUTROL 1 MMOL/ML IV SOLN COMPARISON:  None. FINDINGS: Bones/Joint/Cartilage No findings of osteomyelitis. No substantial joint effusion to indicate septic arthritis. Ligaments Today's exam was not focused on a specific joint and large field of view images reduce sensitivity for ligamentous assessment. No obvious ligamentous derangement is observed. Muscles and Tendons Mild edema signal and enhancement infiltrating the superficial head of the flexor pollicis brevis and the adductor pollicis muscles for example on image 13 series 6 and image 12 series 6. There may also be some extension in deep pad of the flexor pollicis brevis. No intramuscular abscess is identified. Soft tissues Subcutaneous edema in the hand notable along the thenar region and also dorsally in the hand and tracking into the dorsal fingers, but without drainable abscess. No visible gas in the soft tissues. IMPRESSION: 1. Low-grade infiltrative edema signal in the flexor pollicis brevis and adductor pollicis muscles compatible with low-grade myositis. There is also infiltrative subcutaneous edema along the thenar region and dorsum of the hand. No  drainable abscess. No osteomyelitis identified. Electronically Signed   By: Gaylyn Rong M.D.   On: 10/21/2021 16:31    Procedures Procedures   Medications Ordered in ED Medications  vancomycin (VANCOREADY) IVPB 1250 mg/250 mL (1,250 mg Intravenous New Bag/Given 10/21/21 1220)  thyroid (ARMOUR) tablet 30 mg (30 mg Oral Given 10/21/21 0924)  acetaminophen (TYLENOL) tablet 650 mg (650 mg Oral Given 10/20/21 2137)    Or  acetaminophen (TYLENOL) suppository 650 mg ( Rectal See Alternative 10/20/21 2137)  ketorolac (TORADOL) 30 MG/ML injection 30 mg (30 mg Intravenous Given 10/21/21 1216)  ondansetron (ZOFRAN) tablet 4 mg (4 mg Oral Given 10/21/21 1523)    Or  ondansetron (ZOFRAN) injection 4 mg ( Intravenous See Alternative 10/21/21 1523)  acidophilus (RISAQUAD) capsule 2 capsule (2 capsules Oral Given 10/21/21 1637)  clindamycin (CLEOCIN) IVPB 600 mg (600 mg Intravenous New Bag/Given 10/21/21 1438)  liothyronine (CYTOMEL) tablet 5 mcg (has no administration in time range)  emtricitabine-tenofovir (TRUVADA) 200-300 MG per tablet 1 tablet (has no administration in time range)  lactated ringers bolus 1,000 mL (0 mLs Intravenous Stopped 10/20/21 1305)  Tdap (BOOSTRIX) injection 0.5 mL (0.5 mLs Intramuscular Given 10/20/21 1156)  vancomycin (VANCOCIN) IVPB 1000 mg/200 mL premix (0 mg Intravenous Stopped 10/20/21 1415)  gadobutrol (GADAVIST) 1 MMOL/ML injection 8 mL (8 mLs Intravenous Contrast Given 10/21/21 1419)    ED Course  I have reviewed the triage vital signs and the nursing notes.  Pertinent labs & imaging results that were available during my care of the patient were reviewed by me and considered in my medical decision making (see chart for details).    MDM Rules/Calculators/A&P                         Patient presents for worsening cellulitis.  She is on day 3 of Keflex and day 2 of doxycycline.  She received 1 dose of vancomycin in the ED yesterday.  She is afebrile with  normal blood pressure and normal heart rate upon arrival.  Yesterday, area of concern looks like this:   Today, area looks like  this:     Presentation is consistent with failure of outpatient antibiotics. Margins of cellulitis were marked with surgical marker.  Vancomycin was ordered.  She remained hemodynamically stable in the ED.  She declined any analgesia. Patient was admitted to medicine for further management.  Final Clinical Impression(s) / ED Diagnoses Final diagnoses:  Cellulitis of left upper extremity    Rx / DC Orders ED Discharge Orders     None        Godfrey Pick, MD 10/21/21 2112

## 2021-10-20 NOTE — ED Triage Notes (Signed)
Pt arrives to ED for re-check of cellulitis to left hand. Was seen in ED yesterday and told to stay on her Keflex and start Doxycycline.

## 2021-10-20 NOTE — ED Notes (Signed)
[facility] at Bedside. 

## 2021-10-20 NOTE — ED Notes (Signed)
Patient made aware of Admission Status; patient signed Transfer Consent willingly.

## 2021-10-20 NOTE — Progress Notes (Signed)
Pt roomed to unit at 1830hrs. Alert ,oriented and able to communicate needs. Husband at bedside. Waiting for admission orders, patient placement notified

## 2021-10-20 NOTE — ED Notes (Signed)
Patient Report/Care Handoff given to [facility] at this Time. All Questions answered.

## 2021-10-20 NOTE — Progress Notes (Signed)
Pharmacy Antibiotic Note  Jocelyn Foster is a 50 y.o. female admitted on 10/20/2021 with cellulitis.  Patient went home on Keflex and Doxycycline. At recheck of wound, pharmacy was consulted for vancomycin dosing.  Plan: Vancomycin 1250mg  IV q24hr (eAUC: 505.1)   Follow up clinical course, WBC, renal function. Deescalate when able. Levels at steady state.   Temp (24hrs), Avg:98.7 F (37.1 C), Min:98.7 F (37.1 C), Max:98.7 F (37.1 C)  Recent Labs  Lab 10/19/21 0943  WBC 6.2  CREATININE 0.91     Estimated Creatinine Clearance: 78.7 mL/min (by C-G formula based on SCr of 0.91 mg/dL).    Allergies  Allergen Reactions   Erythromycin Nausea And Vomiting    Antimicrobials this admission: Vancomycin 12/19 >>  Microbiology results: pending  Thank you for allowing pharmacy to be a part of this patients care.  1/20 PharmD Candidate 10/20/2021 11:45 AM

## 2021-10-21 ENCOUNTER — Inpatient Hospital Stay (HOSPITAL_COMMUNITY): Payer: PRIVATE HEALTH INSURANCE

## 2021-10-21 DIAGNOSIS — L03012 Cellulitis of left finger: Secondary | ICD-10-CM

## 2021-10-21 DIAGNOSIS — E039 Hypothyroidism, unspecified: Secondary | ICD-10-CM

## 2021-10-21 DIAGNOSIS — Z206 Contact with and (suspected) exposure to human immunodeficiency virus [HIV]: Secondary | ICD-10-CM

## 2021-10-21 MED ORDER — LIOTHYRONINE SODIUM 5 MCG PO TABS
5.0000 ug | ORAL_TABLET | Freq: Every day | ORAL | Status: DC
  Administered 2021-10-22 – 2021-10-25 (×4): 5 ug via ORAL
  Filled 2021-10-21 (×4): qty 1

## 2021-10-21 MED ORDER — CLINDAMYCIN PHOSPHATE 600 MG/50ML IV SOLN
600.0000 mg | Freq: Three times a day (TID) | INTRAVENOUS | Status: DC
  Administered 2021-10-21 – 2021-10-22 (×3): 600 mg via INTRAVENOUS
  Filled 2021-10-21 (×3): qty 50

## 2021-10-21 MED ORDER — EMTRICITABINE-TENOFOVIR DF 200-300 MG PO TABS
1.0000 | ORAL_TABLET | Freq: Every day | ORAL | Status: DC
Start: 2021-10-22 — End: 2021-10-25
  Administered 2021-10-22 – 2021-10-25 (×4): 1 via ORAL
  Filled 2021-10-21 (×4): qty 1

## 2021-10-21 MED ORDER — RISAQUAD PO CAPS
2.0000 | ORAL_CAPSULE | Freq: Three times a day (TID) | ORAL | Status: DC
  Administered 2021-10-21 – 2021-10-25 (×12): 2 via ORAL
  Filled 2021-10-21 (×12): qty 2

## 2021-10-21 MED ORDER — GADOBUTROL 1 MMOL/ML IV SOLN
8.0000 mL | Freq: Once | INTRAVENOUS | Status: AC | PRN
  Administered 2021-10-21: 14:00:00 8 mL via INTRAVENOUS

## 2021-10-21 NOTE — Progress Notes (Addendum)
PROGRESS NOTE  Jocelyn Foster WUJ:[id number] DOB: 08-07-1971   PCP: Pcp, No  Patient is from: Home  DOA: 10/20/2021 LOS: 1  Chief complaints:  Chief Complaint  Patient presents with   Cellulitis     Brief Narrative / Interim history: 50 year old F with PMH of chronic HIV exposure on Truvada and hypothyroidism presenting with left hand and arm swelling and pain after she had a cut while opening a car in about 7 days prior.  She had I&D at local urgent care 3 days prior, and started on p.o. Keflex.  However, swelling and pain persisted, and she developed progressive erythematous skin rash in his arm that prompted her to come to ED.  She was afebrile and hemodynamically stable.  No leukocytosis.  Started on IV vancomycin.  MRI of left hand ordered.    Subjective: Seen and examined earlier this morning.  No major events overnight of this morning.  She noted progression of erythema.  Pain is about the same.  She also complains headache.  She denies numbness or tingling in her fingers or hand.  Objective: Vitals:   10/20/21 2013 10/21/21 0337 10/21/21 0732 10/21/21 1437  BP: 125/88 126/85 125/86 125/88  Pulse: 91 83 85 88  Resp:  _0 Temp: 98.4 F (36.9 C) 98.5 F (36.9 C) 97.7 F (36.5 C) 98.2 F (36.8 C)  TempSrc: Oral Oral Oral Oral  SpO2: 99% 95% 98% 99%  Weight:      Height:        Examination:  GENERAL: No apparent distress.  Nontoxic. HEENT: MMM.  Vision and hearing grossly intact.  NECK: Supple.  No apparent JVD.  RESP: 99% on RA.  No IWOB.  Fair aeration bilaterally. CVS:  RRR. Heart sounds normal.  ABD/GI/GU: BS+. Abd soft, NTND.  MSK/EXT:  Moves extremities. No apparent deformity. No edema.  SKIN: Erythema over medial and plantar aspect of the arm extending from her hands to upper arm.  Swelling over palmar aspect of first web with central opening.  No current drainage.  Neurovascular intact. NEURO: Awake, alert and oriented appropriately.  No  apparent focal neuro deficit. PSYCH: Calm. Normal affect.       Procedures:  None  Microbiology summarized: NFAOZ-30 and influenza PCR nonreactive. Blood cultures NGTD.  Assessment & Plan: Left hand cellulitis/Lymphangitis with possible abscess-patient had cut to left hand while opening on 7 days prior.  No improvement despite I&D at urgent care and oral antibiotics (p.o. Keflex).  I&D culture from 12/19 NGTD but gram stain with rare GPC's. CRP and ESR 6.9 and 13 respectively suggesting acute inflammation/infection.  Slight progression in her erythema overnight.  Otherwise, seems to be stable.  Blood cultures NGTD.   -Follow MRI -Continue IV vancomycin -Added IV clindamycin  -Discontinue p.o. Keflex-did not help outpatient. -Doubt need for gram-negative coverage at this time -Discussed with orthopedic surgery-recommended calling back after MRI result   HIV prophylaxis -Continue Truvada   Hypothyroid -Continue Synthroid.  Class I obesity Body mass index is 30.45 kg/m.         DVT prophylaxis:  SCDs Start: 10/20/21 1942  Code Status: Full code Family Communication: Updated patient's husband at bedside Level of care: Med-Surg Status is: Inpatient  Remains inpatient appropriate because: Due to need for IV antibiotics and further evaluation of left hand and arm infection       Consultants:  Orthopedic surgery   Sch Meds:  Scheduled Meds:  acidophilus  2 capsule Oral TID   [  START ON 10/22/2021] emtricitabine-tenofovir  1 tablet Oral Daily   [START ON 10/22/2021] liothyronine  5 mcg Oral Daily   thyroid  30 mg Oral q morning   Continuous Infusions:  clindamycin (CLEOCIN) IV 600 mg (10/21/21 1438)   vancomycin 1,250 mg (10/21/21 1220)   PRN Meds:.acetaminophen **OR** acetaminophen, ketorolac, ondansetron **OR** ondansetron (ZOFRAN) IV  Antimicrobials: Anti-infectives (From admission, onward)    Start     Dose/Rate Route Frequency Ordered Stop   10/22/21  1000  emtricitabine-tenofovir (TRUVADA) 200-300 MG per tablet 1 tablet        1 tablet Oral Daily 10/21/21 1358     10/21/21 1400  clindamycin (CLEOCIN) IVPB 600 mg        600 mg 100 mL/hr over 30 Minutes Intravenous Every 8 hours 10/21/21 0914     10/21/21 1000  vancomycin (VANCOREADY) IVPB 1250 mg/250 mL        1,250 mg 166.7 mL/hr over 90 Minutes Intravenous Every 24 hours 10/20/21 1249     10/20/21 2200  ceFAZolin (ANCEF) IVPB 1 g/50 mL premix  Status:  Discontinued        1 g 100 mL/hr over 30 Minutes Intravenous Every 8 hours 10/20/21 1923 10/21/21 0914   10/20/21 2030  emtricitabine-tenofovir AF (DESCOVY) 200-25 MG per tablet 1 tablet  Status:  Discontinued        1 tablet Oral Daily 10/20/21 1942 10/21/21 1358   10/20/21 1300  vancomycin (VANCOCIN) IVPB 1000 mg/200 mL premix        1,000 mg 200 mL/hr over 60 Minutes Intravenous STAT 10/20/21 1249 10/20/21 1415   10/20/21 1200  vancomycin (VANCOREADY) IVPB 1250 mg/250 mL  Status:  Discontinued        1,250 mg 166.7 mL/hr over 90 Minutes Intravenous Every 24 hours 10/20/21 1158 10/20/21 1249        I have personally reviewed the following labs and images: CBC: Recent Labs  Lab 10/19/21 0943 10/20/21 1130  WBC 6.2 5.8  NEUTROABS  --  2.6  HGB 14.4 13.4  HCT 42.8 40.0  MCV 91.5 91.1  PLT 148* 154   BMP &GFR Recent Labs  Lab 10/19/21 0943 10/20/21 1130  NA 135 136  K 3.6 3.6  CL 100 102  CO2 29 26  GLUCOSE 113* 106*  BUN 15 15  CREATININE 0.91 0.82  CALCIUM 8.9 8.3*   Estimated Creatinine Clearance: 87.3 mL/min (by C-G formula based on SCr of 0.82 mg/dL). Liver & Pancreas: No results for input(s): AST, ALT, ALKPHOS, BILITOT, PROT, ALBUMIN in the last 168 hours. No results for input(s): LIPASE, AMYLASE in the last 168 hours. No results for input(s): AMMONIA in the last 168 hours. Diabetic: No results for input(s): HGBA1C in the last 72 hours. No results for input(s): GLUCAP in the last 168 hours. Cardiac  Enzymes: No results for input(s): CKTOTAL, CKMB, CKMBINDEX, TROPONINI in the last 168 hours. No results for input(s): PROBNP in the last 8760 hours. Coagulation Profile: No results for input(s): INR, PROTIME in the last 168 hours. Thyroid Function Tests: No results for input(s): TSH, T4TOTAL, FREET4, T3FREE, THYROIDAB in the last 72 hours. Lipid Profile: No results for input(s): CHOL, HDL, LDLCALC, TRIG, CHOLHDL, LDLDIRECT in the last 72 hours. Anemia Panel: No results for input(s): VITAMINB12, FOLATE, FERRITIN, TIBC, IRON, RETICCTPCT in the last 72 hours. Urine analysis: No results found for: COLORURINE, APPEARANCEUR, LABSPEC, PHURINE, GLUCOSEU, HGBUR, BILIRUBINUR, KETONESUR, PROTEINUR, UROBILINOGEN, NITRITE, LEUKOCYTESUR Sepsis Labs: Invalid input(s): PROCALCITONIN, [place]  Microbiology:  Recent Results (from the past 240 hour(s))  Aerobic Culture w Gram Stain (superficial specimen)     Status: None (Preliminary result)   Collection Time: 10/19/21 10:54 AM   Specimen: Wound  Result Value Ref Range Status   Specimen Description WOUND  Final   Special Requests LEFT HAND  Final   Gram Stain   Final    FEW WBC PRESENT,BOTH PMN AND MONONUCLEAR RARE GRAM POSITIVE COCCI    Culture   Final    NO GROWTH 2 DAYS Performed at [facility], [address], [place], [place] [address]    Report Status PENDING  Incomplete  Culture, blood (routine x 2)     Status: None (Preliminary result)   Collection Time: 10/20/21 11:30 AM   Specimen: Right Antecubital; Blood  Result Value Ref Range Status   Specimen Description   Final    RIGHT ANTECUBITAL Performed at [facility], [address] [place], [place] [address]    Special Requests   Final    BOTTLES DRAWN AEROBIC AND ANAEROBIC Blood Culture adequate volume Performed at [facility], [address], [place], [place] [address]    Culture   Final    NO GROWTH < 24 HOURS Performed at  [facility], [place] [address] [place], [place] [address]    Report Status PENDING  Incomplete  Resp Panel by RT-PCR (Flu A&B, Covid) Nasopharyngeal Swab     Status: None   Collection Time: 10/20/21 11:30 AM   Specimen: Nasopharyngeal Swab; Nasopharyngeal(NP) swabs in vial transport medium  Result Value Ref Range Status   SARS Coronavirus 2 by RT PCR NEGATIVE NEGATIVE Final    Comment: (NOTE) SARS-CoV-2 target nucleic acids are NOT DETECTED.  The SARS-CoV-2 RNA is generally detectable in upper respiratory specimens during the acute phase of infection. The lowest concentration of SARS-CoV-2 viral copies this assay can detect is 138 copies/mL. A negative result does not preclude SARS-Cov-2 infection and should not be used as the sole basis for treatment or other patient management decisions. A negative result may occur with  improper specimen collection/handling, submission of specimen other than nasopharyngeal swab, presence of viral mutation(s) within the areas targeted by this assay, and inadequate number of viral copies(<138 copies/mL). A negative result must be combined with clinical observations, patient history, and epidemiological information. The expected result is Negative.  Fact Sheet for Patients:  EntrepreneurPulse.com.au  Fact Sheet for Healthcare Providers:  IncredibleEmployment.be  This test is no t yet approved or cleared by the [place] FDA and  has been authorized for detection and/or diagnosis of SARS-CoV-2 by FDA under an Emergency Use Authorization (EUA). This EUA will remain  in effect (meaning this test can be used) for the duration of the COVID-19 declaration under Section 564(b)(1) of the Act, 21 U.S.C.section 360bbb-3(b)(1), unless the authorization is terminated  or revoked sooner.       Influenza A by PCR NEGATIVE NEGATIVE Final   Influenza B by PCR NEGATIVE NEGATIVE Final    Comment: (NOTE) The Xpert  Xpress SARS-CoV-2/FLU/RSV plus assay is intended as an aid in the diagnosis of influenza from Nasopharyngeal swab specimens and should not be used as a sole basis for treatment. Nasal washings and aspirates are unacceptable for Xpert Xpress SARS-CoV-2/FLU/RSV testing.  Fact Sheet for Patients: EntrepreneurPulse.com.au  Fact Sheet for Healthcare Providers: IncredibleEmployment.be  This test is not yet approved or cleared by the [place] FDA and has been authorized for detection and/or diagnosis  of SARS-CoV-2 by FDA under an Emergency Use Authorization (EUA). This EUA will remain in effect (meaning this test can be used) for the duration of the COVID-19 declaration under Section 564(b)(1) of the Act, 21 U.S.C. section 360bbb-3(b)(1), unless the authorization is terminated or revoked.  Performed at [facility], [address] [place], [place] [address]     Radiology Studies: No results found.    Zakara Parkey T. Graeagle  If 7PM-7AM, please contact night-coverage [email or web address] 10/21/2021, 2:42 PM

## 2021-10-22 ENCOUNTER — Other Ambulatory Visit (HOSPITAL_COMMUNITY): Payer: Self-pay

## 2021-10-22 DIAGNOSIS — I891 Lymphangitis: Secondary | ICD-10-CM

## 2021-10-22 DIAGNOSIS — L03114 Cellulitis of left upper limb: Secondary | ICD-10-CM

## 2021-10-22 LAB — ANTISTREPTOLYSIN O TITER: ASO: 42 IU/mL (ref 0.0–200.0)

## 2021-10-22 LAB — AEROBIC CULTURE W GRAM STAIN (SUPERFICIAL SPECIMEN): Culture: NO GROWTH

## 2021-10-22 MED ORDER — LINEZOLID 600 MG/300ML IV SOLN
600.0000 mg | Freq: Two times a day (BID) | INTRAVENOUS | Status: DC
Start: 2021-10-22 — End: 2021-10-22

## 2021-10-22 MED ORDER — SODIUM CHLORIDE 0.9 % IV SOLN
2.0000 g | INTRAVENOUS | Status: DC
  Administered 2021-10-22 – 2021-10-24 (×3): 2 g via INTRAVENOUS
  Filled 2021-10-22 (×3): qty 20

## 2021-10-22 MED ORDER — LINEZOLID 600 MG PO TABS
600.0000 mg | ORAL_TABLET | Freq: Two times a day (BID) | ORAL | Status: DC
  Administered 2021-10-22 – 2021-10-25 (×6): 600 mg via ORAL
  Filled 2021-10-22 (×7): qty 1

## 2021-10-22 NOTE — Consult Note (Signed)
[facility]    Date of Admission:  10/20/2021     Total days of antibiotics 3               Reason for Consult: Cellulitis   Referring Provider: Alanda Slim Primary Care Provider: Pcp, No   ASSESSMENT:  Jocelyn Foster is a 50 y/o female admitted with worsening left hand pain and edema following a cut she sustained to her thenar webspace opening a can. Now s/p I&D x 2 with MRI showing myositis with no osteomyelitis. Symptoms appear consistent with lymphangitis and unclear role of the pustules which may represent Staph infection. Change antibiotics to Zyvox and Ceftriaxone. Zyvox will also add anti-toxin coverage to the regimen. Await any further input from orthopedics/hand surgery. Tetanus vaccine updated on 10/20/21.  Will check RPR. No risk factors that would be concerning for orthopoxvirus.  Plan discussed with Jocelyn Foster and husband at beside. Remaining medical and supportive care per primary team.   PLAN:  Change antibiotics to Zyvox and Ceftriaxone. Check RPR.   Elevate hand/wrist as able.  Await any further recommendations from ortho/hand surgery.  Remaining medical and supportive care per primary team.    Principal Problem:   Cellulitis of arm Active Problems:   Cellulitis of finger of left hand    acidophilus  2 capsule Oral TID   emtricitabine-tenofovir  1 tablet Oral Daily   linezolid  600 mg Oral Q12H   liothyronine  5 mcg Oral Daily   thyroid  30 mg Oral q morning     HPI: Jocelyn Foster is a 50 y.o. female with previous medical history hypothyroidism and PrEP for HIV admitted with worsening left hand swelling and pain.  Jocelyn Foster sustain a cut to her her left thenar webspace on 10/15/21 when opening a can of carrots. Initially believed to be small cut and experienced worsening over the next couple of days with redness and swelling. Was seen at [facility] on 12/18  where I&D was attempted and started on Keflex. Then seen on 10/19/21  at [facility] on 12/19 with another I&D performed and diagnosed with cellulitis and lymphangitis. She received one dose of vancomycin in the ED and doxycycline was added to the previously prescribed Keflex.   Jocelyn Foster continued to have worsening pain and edema returning to [facility] ED. There was suspected lymph spreading of left hand  infection and she was started on Vancomycin and Cefazolin. Discussed with orthopedics/hand surgery with recommendations for MRI.  MRI left hand with low-grade infiltrative edema in the flexor pollicis brevis and adductor pollicis muscle compatible with low grade myositis; and infiltrative subcutaneous edema along the dorsum of the hand with no drainable abscess or osteomyelitis.   Jocelyn Foster has been afebrile since admission and currently on Day 3 of antibiotic treatment with vancomycin and clindamycin. Has had worsening edema and redness and had developed multiple pustules located on her left arm, back , and upper legs.    Review of Systems: Review of Systems  Constitutional:  Negative for chills, fever and weight loss.  Respiratory:  Negative for cough, shortness of breath and wheezing.   Cardiovascular:  Negative for chest pain and leg swelling.  Gastrointestinal:  Negative for abdominal pain, constipation, diarrhea, nausea and vomiting.  Musculoskeletal:        Positive for left hand / upper extremity pain  Skin:  Positive for rash.    Past Medical History:  Diagnosis Date   Hypothyroidism  Social History   Tobacco Use   Smoking status: Former    Types: Cigarettes   Smokeless tobacco: Never  Substance Use Topics   Alcohol use: Yes    Comment: weekly   Drug use: Never    Family History  Problem Relation Age of Onset   Breast cancer Sister 38    Allergies  Allergen Reactions   Erythromycin Nausea And Vomiting    OBJECTIVE: Blood pressure 118/78, pulse 85, temperature 98.8 F (37.1 C), temperature source Oral, resp. rate  18, height 5\' 5"  (1.651 m), weight 83 kg, SpO2 96 %.  Physical Exam Constitutional:      General: She is not in acute distress.    Appearance: She is well-developed.  Eyes:     Conjunctiva/sclera: Conjunctivae normal.  Cardiovascular:     Rate and Rhythm: Normal rate and regular rhythm.     Heart sounds: Normal heart sounds. No murmur heard.   No friction rub. No gallop.  Pulmonary:     Effort: Pulmonary effort is normal. No respiratory distress.     Breath sounds: Normal breath sounds. No wheezing or rales.  Chest:     Chest wall: No tenderness.  Abdominal:     General: Bowel sounds are normal.     Palpations: Abdomen is soft.     Tenderness: There is no abdominal tenderness.  Musculoskeletal:     Cervical back: Neck supple.     Comments: Redness and edema of left upper extremity that has progressed beyond previously marked borders and goes from her hand up her upper extremity. There are several pustules of various sizes and in various stages located on her bilateral upper/lower extremities, back and face.   Lymphadenopathy:     Cervical: No cervical adenopathy.  Skin:    General: Skin is warm and dry.     Findings: No rash.  Neurological:     Mental Status: She is alert and oriented to person, place, and time.  Psychiatric:        Mood and Affect: Mood normal.    Lab Results Lab Results  Component Value Date   WBC 5.8 10/20/2021   HGB 13.4 10/20/2021   HCT 40.0 10/20/2021   MCV 91.1 10/20/2021   PLT 154 10/20/2021    Lab Results  Component Value Date   CREATININE 0.82 10/20/2021   BUN 15 10/20/2021   NA 136 10/20/2021   K 3.6 10/20/2021   CL 102 10/20/2021   CO2 26 10/20/2021   No results found for: ALT, AST, GGT, ALKPHOS, BILITOT   Microbiology: Recent Results (from the past 240 hour(s))  Aerobic Culture w Gram Stain (superficial specimen)     Status: None   Collection Time: 10/19/21 10:54 AM   Specimen: Wound  Result Value Ref Range Status   Specimen  Description WOUND  Final   Special Requests LEFT HAND  Final   Gram Stain   Final    FEW WBC PRESENT,BOTH PMN AND MONONUCLEAR RARE GRAM POSITIVE COCCI    Culture   Final    NO GROWTH 2 DAYS Performed at [facility], [address], [place], [place]    Report Status 10/22/2021 FINAL  Final  Culture, blood (routine x 2)     Status: None (Preliminary result)   Collection Time: 10/20/21 11:30 AM   Specimen: Right Antecubital; Blood  Result Value Ref Range Status   Specimen Description   Final    RIGHT ANTECUBITAL Performed at [facility], [address] [place], [place] [address]    Special Requests   Final    BOTTLES DRAWN AEROBIC AND ANAEROBIC Blood Culture adequate volume Performed at [facility], [address], [place], [place] [address]    Culture   Final    NO GROWTH 2 DAYS Performed at [facility], [address], [place], [place] [address]    Report Status PENDING  Incomplete  Resp Panel by RT-PCR (Flu A&B, Covid) Nasopharyngeal Swab     Status: None   Collection Time: 10/20/21 11:30 AM   Specimen: Nasopharyngeal Swab; Nasopharyngeal(NP) swabs in vial transport medium  Result Value Ref Range Status   SARS Coronavirus 2 by RT PCR NEGATIVE NEGATIVE Final    Comment: (NOTE) SARS-CoV-2 target nucleic acids are NOT DETECTED.  The SARS-CoV-2 RNA is generally detectable in upper respiratory specimens during the acute phase of infection. The lowest concentration of SARS-CoV-2 viral copies this assay can detect is 138 copies/mL. A negative result does not preclude SARS-Cov-2 infection and should not be used as the sole basis for treatment or other patient management decisions. A negative result may occur with  improper specimen collection/handling, submission of specimen other than nasopharyngeal swab, presence of viral mutation(s) within the areas targeted by this assay, and inadequate number of  viral copies(<138 copies/mL). A negative result must be combined with clinical observations, patient history, and epidemiological information. The expected result is Negative.  Fact Sheet for Patients:  BloggerCourse.com  Fact Sheet for Healthcare Providers:  SeriousBroker.it  This test is no t yet approved or cleared by the [place] FDA and  has been authorized for detection and/or diagnosis of SARS-CoV-2 by FDA under an Emergency Use Authorization (EUA). This EUA will remain  in effect (meaning this test can be used) for the duration of the COVID-19 declaration under Section 564(b)(1) of the Act, 21 U.S.C.section 360bbb-3(b)(1), unless the authorization is terminated  or revoked sooner.       Influenza A by PCR NEGATIVE NEGATIVE Final   Influenza B by PCR NEGATIVE NEGATIVE Final    Comment: (NOTE) The Xpert Xpress SARS-CoV-2/FLU/RSV plus assay is intended as an aid in the diagnosis of influenza from Nasopharyngeal swab specimens and should not be used as a sole basis for treatment. Nasal washings and aspirates are unacceptable for Xpert Xpress SARS-CoV-2/FLU/RSV testing.  Fact Sheet for Patients: BloggerCourse.com  Fact Sheet for Healthcare Providers: SeriousBroker.it  This test is not yet approved or cleared by the [place] FDA and has been authorized for detection and/or diagnosis of SARS-CoV-2 by FDA under an Emergency Use Authorization (EUA). This EUA will remain in effect (meaning this test can be used) for the duration of the COVID-19 declaration under Section 564(b)(1) of the Act, 21 U.S.C. section 360bbb-3(b)(1), unless the authorization is terminated or revoked.  Performed at [facility], [address] [place], [place] [address]      Marcos Eke, NP [facility]  10/22/2021  1:10 PM

## 2021-10-22 NOTE — Progress Notes (Signed)
PROGRESS NOTE  Kathlee Barnhardt [facility]:[id number] DOB: Dec 09, 1970   PCP: Pcp, No  Patient is from: Home  DOA: 10/20/2021 LOS: 2  Chief complaints:  Chief Complaint  Patient presents with   Cellulitis     Brief Narrative / Interim history: 50 year old F with PMH of chronic HIV exposure on PrEP/Truvada and hypothyroidism presenting with left hand and arm swelling and pain after she had a cut while opening a car in about 7 days prior.  She had I&D at local urgent care 3 days prior, and started on p.o. Keflex.  However, swelling and pain persisted, and she developed progressive erythematous skin rash in his arm that prompted her to come to ED.  She was afebrile and hemodynamically stable.  No leukocytosis.  Started on IV vancomycin and Keflex.  Blood cultures NGTD.  MRI of her right hand with low-grade infiltrative edema in the flexor pollicis brevis and adductor pollicis muscle compatible with low grade myositis; and infiltrative subcutaneous edema along the dorsum of the hand with no drainable abscess or osteomyelitis.  Infectious disease consulted.  Changed antibiotic to Zyvox and ceftriaxone  Subjective: Seen and examined earlier this morning.  No major events overnight of this morning.  She had shivering episode yesterday evening.  She was not febrile.  Hemodynamically stable.  Erythema slightly worse today.  Swelling better.  Denies numbness and tingling.  He she has new pustules on her left arm, back and upper legs.  Objective: Vitals:   10/21/21 1850 10/21/21 2212 10/22/21 0556 10/22/21 0751  BP: (!) 144/87 115/71 118/79 118/78  Pulse: 99 (!) 103 83 85  Resp: $Remo'17 17 17 18  'llrhk$ Temp: 98 F (36.7 C) 98.4 F (36.9 C) 98.3 F (36.8 C) 98.8 F (37.1 C)  TempSrc: Oral Oral Oral Oral  SpO2: 100% 96% 97% 96%  Weight:      Height:        Examination:  GENERAL: No apparent distress.  Nontoxic. HEENT: MMM.  Vision and hearing grossly intact.  NECK: Supple.  No apparent JVD.  RESP:  96% on RA.  No IWOB.  Fair aeration bilaterally. CVS:  RRR. Heart sounds normal.  ABD/GI/GU: BS+. Abd soft, NTND.  MSK/EXT: Swelling in RUE.  Skin changes as below. SKIN: Erythema over medial and plantar aspect of left arm extending to axillary region.  Swelling over palmar aspect of first web with central opening.  No drainage.  Neurovascular intact. NEURO: Awake and alert. Oriented appropriately.  No apparent focal neuro deficit. PSYCH: Calm. Normal affect.        Procedures:  None  Microbiology summarized: [id number] and influenza PCR nonreactive. Blood cultures NGTD.  Assessment & Plan: Left hand cellulitis/Lymphangitis/pustular rash:patient had cut to left hand while opening on 7 days prior.  No improvement despite I&D at urgent care and oral antibiotics (p.o. Keflex).  I&D culture from 12/19 NGTD but gram stain with rare GPC's. CRP and ESR 6.9 and 13 respectively suggesting acute inflammation/infection.  Slight progression in her erythema overnight.  Otherwise, seems to be stable.  Blood cultures NGTD.  MRI as above. -Appreciate guidance by ID-on Zyvox and IV ceftriaxone -Encourage right arm elevation   HIV prophylaxis -Continue Truvada -Follow RPR    Hypothyroid -Continue Synthroid.  Class I obesity Body mass index is 30.45 kg/m.         DVT prophylaxis:  SCDs Start: 10/20/21 1942  Code Status: Full code Family Communication: Updated patient's husband at bedside Level of care: Med-Surg Status is: Inpatient  Remains inpatient appropriate because: Due to need for IV antibiotics and further evaluation of left hand and arm infection       Consultants:  Infectious disease   Sch Meds:  Scheduled Meds:  acidophilus  2 capsule Oral TID   emtricitabine-tenofovir  1 tablet Oral Daily   linezolid  600 mg Oral Q12H   liothyronine  5 mcg Oral Daily   thyroid  30 mg Oral q morning   Continuous Infusions:  cefTRIAXone (ROCEPHIN)  IV 2 g (10/22/21 1200)   PRN  Meds:.acetaminophen **OR** acetaminophen, ketorolac, ondansetron **OR** ondansetron (ZOFRAN) IV  Antimicrobials: Anti-infectives (From admission, onward)    Start     Dose/Rate Route Frequency Ordered Stop   10/22/21 2200  linezolid (ZYVOX) IVPB 600 mg  Status:  Discontinued        600 mg 300 mL/hr over 60 Minutes Intravenous Every 12 hours 10/22/21 1110 10/22/21 1122   10/22/21 2200  linezolid (ZYVOX) tablet 600 mg        600 mg Oral Every 12 hours 10/22/21 1122     10/22/21 1200  cefTRIAXone (ROCEPHIN) 2 g in sodium chloride 0.9 % 100 mL IVPB        2 g 200 mL/hr over 30 Minutes Intravenous Every 24 hours 10/22/21 1110     10/22/21 1000  emtricitabine-tenofovir (TRUVADA) 200-300 MG per tablet 1 tablet        1 tablet Oral Daily 10/21/21 1358     10/21/21 1400  clindamycin (CLEOCIN) IVPB 600 mg  Status:  Discontinued        600 mg 100 mL/hr over 30 Minutes Intravenous Every 8 hours 10/21/21 0914 10/22/21 1110   10/21/21 1000  vancomycin (VANCOREADY) IVPB 1250 mg/250 mL  Status:  Discontinued        1,250 mg 166.7 mL/hr over 90 Minutes Intravenous Every 24 hours 10/20/21 1249 10/22/21 1110   10/20/21 2200  ceFAZolin (ANCEF) IVPB 1 g/50 mL premix  Status:  Discontinued        1 g 100 mL/hr over 30 Minutes Intravenous Every 8 hours 10/20/21 1923 10/21/21 0914   10/20/21 2030  emtricitabine-tenofovir AF (DESCOVY) 200-25 MG per tablet 1 tablet  Status:  Discontinued        1 tablet Oral Daily 10/20/21 1942 10/21/21 1358   10/20/21 1300  vancomycin (VANCOCIN) IVPB 1000 mg/200 mL premix        1,000 mg 200 mL/hr over 60 Minutes Intravenous STAT 10/20/21 1249 10/20/21 1415   10/20/21 1200  vancomycin (VANCOREADY) IVPB 1250 mg/250 mL  Status:  Discontinued        1,250 mg 166.7 mL/hr over 90 Minutes Intravenous Every 24 hours 10/20/21 1158 10/20/21 1249        I have personally reviewed the following labs and images: CBC: Recent Labs  Lab 10/19/21 0943 10/20/21 1130  WBC 6.2 5.8   NEUTROABS  --  2.6  HGB 14.4 13.4  HCT 42.8 40.0  MCV 91.5 91.1  PLT 148* 154   BMP &GFR Recent Labs  Lab 10/19/21 0943 10/20/21 1130  NA 135 136  K 3.6 3.6  CL 100 102  CO2 29 26  GLUCOSE 113* 106*  BUN 15 15  CREATININE 0.91 0.82  CALCIUM 8.9 8.3*   Estimated Creatinine Clearance: 87.3 mL/min (by C-G formula based on SCr of 0.82 mg/dL). Liver & Pancreas: No results for input(s): AST, ALT, ALKPHOS, BILITOT, PROT, ALBUMIN in the last 168 hours. No results for input(s): LIPASE, AMYLASE  in the last 168 hours. No results for input(s): AMMONIA in the last 168 hours. Diabetic: No results for input(s): HGBA1C in the last 72 hours. No results for input(s): GLUCAP in the last 168 hours. Cardiac Enzymes: No results for input(s): CKTOTAL, CKMB, CKMBINDEX, TROPONINI in the last 168 hours. No results for input(s): PROBNP in the last 8760 hours. Coagulation Profile: No results for input(s): INR, PROTIME in the last 168 hours. Thyroid Function Tests: No results for input(s): TSH, T4TOTAL, FREET4, T3FREE, THYROIDAB in the last 72 hours. Lipid Profile: No results for input(s): CHOL, HDL, LDLCALC, TRIG, CHOLHDL, LDLDIRECT in the last 72 hours. Anemia Panel: No results for input(s): VITAMINB12, FOLATE, FERRITIN, TIBC, IRON, RETICCTPCT in the last 72 hours. Urine analysis: No results found for: COLORURINE, APPEARANCEUR, LABSPEC, PHURINE, GLUCOSEU, HGBUR, BILIRUBINUR, KETONESUR, PROTEINUR, UROBILINOGEN, NITRITE, LEUKOCYTESUR Sepsis Labs: Invalid input(s): PROCALCITONIN, [place]  Microbiology: Recent Results (from the past 240 hour(s))  Aerobic Culture w Gram Stain (superficial specimen)     Status: None   Collection Time: 10/19/21 10:54 AM   Specimen: Wound  Result Value Ref Range Status   Specimen Description WOUND  Final   Special Requests LEFT HAND  Final   Gram Stain   Final    FEW WBC PRESENT,BOTH PMN AND MONONUCLEAR RARE GRAM POSITIVE COCCI    Culture   Final    NO  GROWTH 2 DAYS Performed at [facility], [address] [place], [place] [address]    Report Status 10/22/2021 FINAL  Final  Culture, blood (routine x 2)     Status: None (Preliminary result)   Collection Time: 10/20/21 11:30 AM   Specimen: Right Antecubital; Blood  Result Value Ref Range Status   Specimen Description   Final    RIGHT ANTECUBITAL Performed at [facility], [address], [place], [place] [address]    Special Requests   Final    BOTTLES DRAWN AEROBIC AND ANAEROBIC Blood Culture adequate volume Performed at [facility], [address], [place], [place] [address]    Culture   Final    NO GROWTH 2 DAYS Performed at [facility], [place] [address], [place], [place] [address]    Report Status PENDING  Incomplete  Resp Panel by RT-PCR (Flu A&B, Covid) Nasopharyngeal Swab     Status: None   Collection Time: 10/20/21 11:30 AM   Specimen: Nasopharyngeal Swab; Nasopharyngeal(NP) swabs in vial transport medium  Result Value Ref Range Status   SARS Coronavirus 2 by RT PCR NEGATIVE NEGATIVE Final    Comment: (NOTE) SARS-CoV-2 target nucleic acids are NOT DETECTED.  The SARS-CoV-2 RNA is generally detectable in upper respiratory specimens during the acute phase of infection. The lowest concentration of SARS-CoV-2 viral copies this assay can detect is 138 copies/mL. A negative result does not preclude SARS-Cov-2 infection and should not be used as the sole basis for treatment or other patient management decisions. A negative result may occur with  improper specimen collection/handling, submission of specimen other than nasopharyngeal swab, presence of viral mutation(s) within the areas targeted by this assay, and inadequate number of viral copies(<138 copies/mL). A negative result must be combined with clinical observations, patient history, and epidemiological information. The expected result is Negative.  Fact Sheet for  Patients:  EntrepreneurPulse.com.au  Fact Sheet for Healthcare Providers:  IncredibleEmployment.be  This test is no t yet approved or cleared by the [place] FDA and  has been authorized for detection and/or diagnosis of SARS-CoV-2 by  FDA under an Emergency Use Authorization (EUA). This EUA will remain  in effect (meaning this test can be used) for the duration of the COVID-19 declaration under Section 564(b)(1) of the Act, 21 U.S.C.section 360bbb-3(b)(1), unless the authorization is terminated  or revoked sooner.       Influenza A by PCR NEGATIVE NEGATIVE Final   Influenza B by PCR NEGATIVE NEGATIVE Final    Comment: (NOTE) The Xpert Xpress SARS-CoV-2/FLU/RSV plus assay is intended as an aid in the diagnosis of influenza from Nasopharyngeal swab specimens and should not be used as a sole basis for treatment. Nasal washings and aspirates are unacceptable for Xpert Xpress SARS-CoV-2/FLU/RSV testing.  Fact Sheet for Patients: EntrepreneurPulse.com.au  Fact Sheet for Healthcare Providers: IncredibleEmployment.be  This test is not yet approved or cleared by the [place] FDA and has been authorized for detection and/or diagnosis of SARS-CoV-2 by FDA under an Emergency Use Authorization (EUA). This EUA will remain in effect (meaning this test can be used) for the duration of the COVID-19 declaration under Section 564(b)(1) of the Act, 21 U.S.C. section 360bbb-3(b)(1), unless the authorization is terminated or revoked.  Performed at [facility], [address], [place], [place] [address]     Radiology Studies: No results found.    Chanoch Mccleery T. [place]  If 7PM-7AM, please contact night-coverage [email or web address] 10/22/2021, 3:05 PM

## 2021-10-23 ENCOUNTER — Other Ambulatory Visit (HOSPITAL_COMMUNITY): Payer: Self-pay

## 2021-10-23 DIAGNOSIS — L089 Local infection of the skin and subcutaneous tissue, unspecified: Secondary | ICD-10-CM

## 2021-10-23 DIAGNOSIS — L03114 Cellulitis of left upper limb: Secondary | ICD-10-CM | POA: Diagnosis not present

## 2021-10-23 LAB — RENAL FUNCTION PANEL
Albumin: 2.8 g/dL — ABNORMAL LOW (ref 3.5–5.0)
Anion gap: 6 (ref 5–15)
BUN: 8 mg/dL (ref 6–20)
CO2: 26 mmol/L (ref 22–32)
Calcium: 8.3 mg/dL — ABNORMAL LOW (ref 8.9–10.3)
Chloride: 105 mmol/L (ref 98–111)
Creatinine, Ser: 0.88 mg/dL (ref 0.44–1.00)
GFR, Estimated: >60 mL/min (ref >60)
Glucose, Bld: 105 mg/dL — ABNORMAL HIGH (ref 70–99)
Phosphorus: 2.8 mg/dL (ref 2.5–4.6)
Potassium: 4.2 mmol/L (ref 3.5–5.1)
Sodium: 137 mmol/L (ref 135–145)

## 2021-10-23 LAB — CBC WITH DIFFERENTIAL/PLATELET
Abs Immature Granulocytes: 0.03 10*3/uL (ref 0.00–0.07)
Basophils Absolute: 0.1 10*3/uL (ref 0.0–0.1)
Basophils Relative: 1 %
Eosinophils Absolute: 0.3 10*3/uL (ref 0.0–0.5)
Eosinophils Relative: 4 %
HCT: 37.5 % (ref 36.0–46.0)
Hemoglobin: 12.9 g/dL (ref 12.0–15.0)
Immature Granulocytes: 0 %
Lymphocytes Relative: 59 %
Lymphs Abs: 5 10*3/uL — ABNORMAL HIGH (ref 0.7–4.0)
MCH: 31.3 pg (ref 26.0–34.0)
MCHC: 34.4 g/dL (ref 30.0–36.0)
MCV: 91 fL (ref 80.0–100.0)
Monocytes Absolute: 0.6 10*3/uL (ref 0.1–1.0)
Monocytes Relative: 7 %
Neutro Abs: 2.5 10*3/uL (ref 1.7–7.7)
Neutrophils Relative %: 29 %
Platelets: 197 10*3/uL (ref 150–400)
RBC: 4.12 MIL/uL (ref 3.87–5.11)
RDW: 12.8 % (ref 11.5–15.5)
WBC: 8.5 10*3/uL (ref 4.0–10.5)
nRBC: 0 % (ref 0.0–0.2)

## 2021-10-23 LAB — MAGNESIUM: Magnesium: 2 mg/dL (ref 1.7–2.4)

## 2021-10-23 LAB — RPR: RPR Ser Ql: NONREACTIVE

## 2021-10-23 NOTE — Progress Notes (Signed)
[facility]  Date of Admission:  10/20/2021       Abx: 12/22-c linezolid 12/22-c ceftriaxone Outpatient truvada PrEP   12/20-21 vanc/clinda  ASSESSMENT: 50 yo female on prep therapy here with worsening cellulitis/lymphangitis after cutting her hand on a can, over the past week despite cephalexin (then cephalexin/doxy) outpatient therapy and 2 I&D. Bcx negative (after abx). Wound cx gpc but no growth   Mri no deep infection/OM or abscess Hand surgeon consulted -- no surgical intervention   12/23 assessment Some improvement in cellulitic change LUE  Has a rather atypical pustular rash on trunk and ext. No risk factor for monkey pox. Rpr negative. ?small vessel vasculitis vs staph bullous impetigo. As of 10/23/2021 appears stable  PLAN: Continue linezolid and ceftriaxone If continued improvement in another 1-2 days change ceftriaxone to cefdinir, and plan 2 weeks total abx duration Monitor pustular rash -- if worsening despite cellulitic improvement, biopsy might be indicated Discussed with primary team Dr Drue Second to see this weekend  Principal Problem:   Cellulitis of arm Active Problems:   Cellulitis of finger of left hand   Allergies  Allergen Reactions   Erythromycin Nausea And Vomiting    Scheduled Meds:  acidophilus  2 capsule Oral TID   emtricitabine-tenofovir  1 tablet Oral Daily   linezolid  600 mg Oral Q12H   liothyronine  5 mcg Oral Daily   thyroid  30 mg Oral q morning   Continuous Infusions:  cefTRIAXone (ROCEPHIN)  IV 2 g (10/23/21 1317)   PRN Meds:.acetaminophen **OR** acetaminophen, ketorolac, ondansetron **OR** ondansetron (ZOFRAN) IV   SUBJECTIVE: Cellulitis better Pustular rash stable, perhaps slight worsening on back No f/c Some nausea   Review of Systems: ROS All other ROS was negative, except mentioned above     OBJECTIVE: Vitals:   10/22/21 1620 10/22/21 2022 10/23/21 0810 10/23/21  1202  BP: 111/75 121/81 127/88 123/83  Pulse: 90 82 77 84  Resp: 19 17 19    Temp: 98 F (36.7 C) 98.3 F (36.8 C) 97.7 F (36.5 C) 97.6 F (36.4 C)  TempSrc: Oral Oral Oral Oral  SpO2: 98% 99% 98% 99%  Weight:      Height:       Body mass index is 30.45 kg/m.  Physical Exam  No distress, pleasant Cv rrr no mrg Lungs clear Abd s/nt  Skin 12/23:                 Lab Results Lab Results  Component Value Date   WBC 8.5 10/23/2021   HGB 12.9 10/23/2021   HCT 37.5 10/23/2021   MCV 91.0 10/23/2021   PLT 197 10/23/2021    Lab Results  Component Value Date   CREATININE 0.88 10/23/2021   BUN 8 10/23/2021   NA 137 10/23/2021   K 4.2 10/23/2021   CL 105 10/23/2021   CO2 26 10/23/2021   No results found for: ALT, AST, GGT, ALKPHOS, BILITOT    Microbiology: Recent Results (from the past 240 hour(s))  Aerobic Culture w Gram Stain (superficial specimen)     Status: None   Collection Time: 10/19/21 10:54 AM   Specimen: Wound  Result Value Ref Range Status   Specimen Description WOUND  Final   Special Requests LEFT HAND  Final   Gram Stain   Final    FEW WBC PRESENT,BOTH PMN AND MONONUCLEAR RARE GRAM POSITIVE COCCI    Culture   Final  NO GROWTH 2 DAYS Performed at [facility], [address] [place], [place] [address]    Report Status 10/22/2021 FINAL  Final  Culture, blood (routine x 2)     Status: None (Preliminary result)   Collection Time: 10/20/21 11:30 AM   Specimen: Right Antecubital; Blood  Result Value Ref Range Status   Specimen Description   Final    RIGHT ANTECUBITAL Performed at [facility], [address] [place], [place] [address]    Special Requests   Final    BOTTLES DRAWN AEROBIC AND ANAEROBIC Blood Culture adequate volume Performed at [facility], [address] [place], [place] [address]    Culture   Final    NO GROWTH 3 DAYS Performed at [facility], [address], [place], [place] [address]    Report Status PENDING  Incomplete  Resp Panel by RT-PCR (Flu A&B, Covid) Nasopharyngeal Swab     Status: None   Collection Time: 10/20/21 11:30 AM   Specimen: Nasopharyngeal Swab; Nasopharyngeal(NP) swabs in vial transport medium  Result Value Ref Range Status   SARS Coronavirus 2 by RT PCR NEGATIVE NEGATIVE Final    Comment: (NOTE) SARS-CoV-2 target nucleic acids are NOT DETECTED.  The SARS-CoV-2 RNA is generally detectable in upper respiratory specimens during the acute phase of infection. The lowest concentration of SARS-CoV-2 viral copies this assay can detect is 138 copies/mL. A negative result does not preclude SARS-Cov-2 infection and should not be used as the sole basis for treatment or other patient management decisions. A negative result may occur with  improper specimen collection/handling, submission of specimen other than nasopharyngeal swab, presence of viral mutation(s) within the areas targeted by this assay, and inadequate number of viral copies(<138 copies/mL). A negative result must be combined with clinical observations, patient history, and epidemiological information. The expected result is Negative.  Fact Sheet for Patients:  BloggerCourse.com  Fact Sheet for Healthcare Providers:  SeriousBroker.it  This test is no t yet approved or cleared by the [place] FDA and  has been authorized for detection and/or diagnosis of SARS-CoV-2 by FDA under an Emergency Use Authorization (EUA). This EUA will remain  in effect (meaning this test can be used) for the duration of the COVID-19 declaration under Section 564(b)(1) of the Act, 21 U.S.C.section 360bbb-3(b)(1), unless the authorization is terminated  or revoked sooner.       Influenza A by PCR NEGATIVE NEGATIVE Final   Influenza B by PCR NEGATIVE NEGATIVE Final    Comment: (NOTE) The Xpert Xpress SARS-CoV-2/FLU/RSV plus  assay is intended as an aid in the diagnosis of influenza from Nasopharyngeal swab specimens and should not be used as a sole basis for treatment. Nasal washings and aspirates are unacceptable for Xpert Xpress SARS-CoV-2/FLU/RSV testing.  Fact Sheet for Patients: BloggerCourse.com  Fact Sheet for Healthcare Providers: SeriousBroker.it  This test is not yet approved or cleared by the [place] FDA and has been authorized for detection and/or diagnosis of SARS-CoV-2 by FDA under an Emergency Use Authorization (EUA). This EUA will remain in effect (meaning this test can be used) for the duration of the COVID-19 declaration under Section 564(b)(1) of the Act, 21 U.S.C. section 360bbb-3(b)(1), unless the authorization is terminated or revoked.  Performed at [facility], [address] [place], [place] [address]      Serology:   Imaging: If present, new imagings (plain films, ct scans, and mri) have been personally visualized and interpreted; radiology  reports have been reviewed. Decision making incorporated into the Impression / Recommendations.   Raymondo Band, MD [facility] [phone] pager    10/23/2021, 2:06 PM

## 2021-10-23 NOTE — Progress Notes (Signed)
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  She had I&D at local urgent care 3 days prior, and started on p.o. Keflex.  However, swelling and pain persisted, and she developed progressive erythematous skin rash in his arm that prompted her to come to ED.  She was afebrile and hemodynamically stable.  No leukocytosis.  Started on IV vancomycin and Keflex.  Blood cultures NGTD.  MRI of her right hand with low-grade infiltrative edema in the flexor pollicis brevis and adductor pollicis muscle compatible with low grade myositis; and infiltrative subcutaneous edema along the dorsum of the hand with no drainable abscess or osteomyelitis.  Infectious disease consulted.  Changed antibiotic to Zyvox and ceftriaxone  Subjective: Seen and examined earlier this morning.  No major events overnight of this morning.  She thinks the rash is spreading on her back but the swelling in her arm seems to be improving.  Erythema on left arm seems to be clearing centrally although expanding peripherally.  No leukocytosis or fever.  Hemodynamically stable.  Objective: Vitals:   10/22/21 1620 10/22/21 2022 10/23/21 0810 10/23/21 1202  BP: 111/75 121/81 127/88 123/83  Pulse: 90 82 77 84  Resp: $Remo'19 17 19   'mhPcK$ Temp: 98 F (36.7 C) 98.3 F (36.8 C) 97.7 F (36.5 C) 97.6 F (36.4 C)  TempSrc: Oral Oral Oral Oral  SpO2: 98% 99% 98% 99%  Weight:      Height:        Examination:  GENERAL: No apparent distress.  Nontoxic. HEENT: MMM.  Vision and hearing grossly intact.  NECK: Supple.  No apparent JVD.  RESP: 99% on RA.   No IWOB.  Fair aeration bilaterally. CVS:  RRR. Heart sounds normal.  ABD/GI/GU: BS+. Abd soft, NTND.  MSK/EXT:  Moves extremities. No apparent deformity.  Slight LLE swelling. SKIN: LLE erythema slightly expanding peripherally but clearing centrally.  Erythematous macules on the back.  Pustules left cheek, right wrist and legs. NEURO: Awake and alert. Oriented appropriately.  No apparent focal neuro deficit. PSYCH: Calm. Normal affect.    Procedures:  None  Microbiology summarized: OFBPZ-02 and influenza PCR nonreactive. Blood cultures NGTD.  Assessment & Plan: Left hand cellulitis/Lymphangitis/pustular rash:patient had cut to left hand while opening on 7 days prior.  No improvement despite I&D at urgent care and oral antibiotics (p.o. Keflex).  I&D culture from 12/19 NGTD but gram stain with rare GPC's. CRP and ESR 6.9 and 13 respectively suggesting acute inflammation/infection.  Slight progression in her erythema overnight.  Otherwise, seems to be stable.  Blood cultures NGTD.  RPR and ASO negative.  MRI as above. -Appreciate guidance by ID-on Zyvox and IV ceftriaxone -Encourage arm elevation   HIV prophylaxis -Continue Truvada -Follow RPR    Hypothyroid -Continue Synthroid.  Class I obesity Body mass index is 30.45 kg/m.         DVT prophylaxis:  SCDs Start: 10/20/21 1942  Code Status: Full code Family Communication: Updated patient's husband at bedside on 12/22.  None at bedside today. Level of care: Med-Surg Status is: Inpatient  Remains inpatient  appropriate because: Due to need for IV antibiotics and further evaluation of left hand and arm infection   Final disposition: Likely home once medically stable.    Consultants:  Infectious disease   Sch Meds:  Scheduled Meds:  acidophilus  2 capsule Oral TID   emtricitabine-tenofovir  1 tablet Oral Daily   linezolid  600 mg Oral Q12H   liothyronine  5 mcg Oral Daily   thyroid  30 mg Oral q morning    Continuous Infusions:  cefTRIAXone (ROCEPHIN)  IV 2 g (10/23/21 1317)   PRN Meds:.acetaminophen **OR** acetaminophen, ketorolac, ondansetron **OR** ondansetron (ZOFRAN) IV  Antimicrobials: Anti-infectives (From admission, onward)    Start     Dose/Rate Route Frequency Ordered Stop   10/22/21 2200  linezolid (ZYVOX) IVPB 600 mg  Status:  Discontinued        600 mg 300 mL/hr over 60 Minutes Intravenous Every 12 hours 10/22/21 1110 10/22/21 1122   10/22/21 2200  linezolid (ZYVOX) tablet 600 mg        600 mg Oral Every 12 hours 10/22/21 1122     10/22/21 1200  cefTRIAXone (ROCEPHIN) 2 g in sodium chloride 0.9 % 100 mL IVPB        2 g 200 mL/hr over 30 Minutes Intravenous Every 24 hours 10/22/21 1110     10/22/21 1000  emtricitabine-tenofovir (TRUVADA) 200-300 MG per tablet 1 tablet        1 tablet Oral Daily 10/21/21 1358     10/21/21 1400  clindamycin (CLEOCIN) IVPB 600 mg  Status:  Discontinued        600 mg 100 mL/hr over 30 Minutes Intravenous Every 8 hours 10/21/21 0914 10/22/21 1110   10/21/21 1000  vancomycin (VANCOREADY) IVPB 1250 mg/250 mL  Status:  Discontinued        1,250 mg 166.7 mL/hr over 90 Minutes Intravenous Every 24 hours 10/20/21 1249 10/22/21 1110   10/20/21 2200  ceFAZolin (ANCEF) IVPB 1 g/50 mL premix  Status:  Discontinued        1 g 100 mL/hr over 30 Minutes Intravenous Every 8 hours 10/20/21 1923 10/21/21 0914   10/20/21 2030  emtricitabine-tenofovir AF (DESCOVY) 200-25 MG per tablet 1 tablet  Status:  Discontinued        1 tablet Oral Daily 10/20/21 1942 10/21/21 1358   10/20/21 1300  vancomycin (VANCOCIN) IVPB 1000 mg/200 mL premix        1,000 mg 200 mL/hr over 60 Minutes Intravenous STAT 10/20/21 1249 10/20/21 1415   10/20/21 1200  vancomycin (VANCOREADY) IVPB 1250 mg/250 mL  Status:  Discontinued        1,250 mg 166.7 mL/hr over 90 Minutes Intravenous Every 24 hours 10/20/21 1158 10/20/21 1249        I have personally reviewed the following labs  and images: CBC: Recent Labs  Lab 10/19/21 0943 10/20/21 1130 10/23/21 0801  WBC 6.2 5.8 8.5  NEUTROABS  --  2.6 2.5  HGB 14.4 13.4 12.9  HCT 42.8 40.0 37.5  MCV 91.5 91.1 91.0  PLT 148* 154 197   BMP &GFR Recent Labs  Lab 10/19/21 0943 10/20/21 1130 10/23/21 0801  NA 135 136 137  K 3.6 3.6 4.2  CL 100 102 105  CO2 $Re'29 26 26  'lSU$ GLUCOSE 113* 106* 105*  BUN $Re'15 15 8  'SZI$ CREATININE 0.91 0.82 0.88  CALCIUM 8.9 8.3* 8.3*  MG  --   --  2.0  PHOS  --   --  2.8  Estimated Creatinine Clearance: 81.4 mL/min (by C-G formula based on SCr of 0.88 mg/dL). Liver & Pancreas: Recent Labs  Lab 10/23/21 0801  ALBUMIN 2.8*   No results for input(s): LIPASE, AMYLASE in the last 168 hours. No results for input(s): AMMONIA in the last 168 hours. Diabetic: No results for input(s): HGBA1C in the last 72 hours. No results for input(s): GLUCAP in the last 168 hours. Cardiac Enzymes: No results for input(s): CKTOTAL, CKMB, CKMBINDEX, TROPONINI in the last 168 hours. No results for input(s): PROBNP in the last 8760 hours. Coagulation Profile: No results for input(s): INR, PROTIME in the last 168 hours. Thyroid Function Tests: No results for input(s): TSH, T4TOTAL, FREET4, T3FREE, THYROIDAB in the last 72 hours. Lipid Profile: No results for input(s): CHOL, HDL, LDLCALC, TRIG, CHOLHDL, LDLDIRECT in the last 72 hours. Anemia Panel: No results for input(s): VITAMINB12, FOLATE, FERRITIN, TIBC, IRON, RETICCTPCT in the last 72 hours. Urine analysis: No results found for: COLORURINE, APPEARANCEUR, LABSPEC, PHURINE, GLUCOSEU, HGBUR, BILIRUBINUR, KETONESUR, PROTEINUR, UROBILINOGEN, NITRITE, LEUKOCYTESUR Sepsis Labs: Invalid input(s): PROCALCITONIN, [place]  Microbiology: Recent Results (from the past 240 hour(s))  Aerobic Culture w Gram Stain (superficial specimen)     Status: None   Collection Time: 10/19/21 10:54 AM   Specimen: Wound  Result Value Ref Range Status   Specimen Description  WOUND  Final   Special Requests LEFT HAND  Final   Gram Stain   Final    FEW WBC PRESENT,BOTH PMN AND MONONUCLEAR RARE GRAM POSITIVE COCCI    Culture   Final    NO GROWTH 2 DAYS Performed at [facility], [address], [place], [place] [address]    Report Status 10/22/2021 FINAL  Final  Culture, blood (routine x 2)     Status: None (Preliminary result)   Collection Time: 10/20/21 11:30 AM   Specimen: Right Antecubital; Blood  Result Value Ref Range Status   Specimen Description   Final    RIGHT ANTECUBITAL Performed at [facility], [address], [place], [place] [address]    Special Requests   Final    BOTTLES DRAWN AEROBIC AND ANAEROBIC Blood Culture adequate volume Performed at [facility], [address], [place], [place] [address]    Culture   Final    NO GROWTH 3 DAYS Performed at [facility], [address], [place], [place] [address]    Report Status PENDING  Incomplete  Resp Panel by RT-PCR (Flu A&B, Covid) Nasopharyngeal Swab     Status: None   Collection Time: 10/20/21 11:30 AM   Specimen: Nasopharyngeal Swab; Nasopharyngeal(NP) swabs in vial transport medium  Result Value Ref Range Status   SARS Coronavirus 2 by RT PCR NEGATIVE NEGATIVE Final    Comment: (NOTE) SARS-CoV-2 target nucleic acids are NOT DETECTED.  The SARS-CoV-2 RNA is generally detectable in upper respiratory specimens during the acute phase of infection. The lowest concentration of SARS-CoV-2 viral copies this assay can detect is 138 copies/mL. A negative result does not preclude SARS-Cov-2 infection and should not be used as the sole basis for treatment or other patient management decisions. A negative result may occur with  improper specimen collection/handling, submission of specimen other than nasopharyngeal swab, presence of viral mutation(s) within the areas targeted by this assay, and inadequate number of viral copies(<138  copies/mL). A negative result must be combined with clinical observations, patient history, and epidemiological information. The expected result is Negative.  Fact Sheet for Patients:  EntrepreneurPulse.com.au  Fact Sheet for Healthcare Providers:  IncredibleEmployment.be  This test is no t yet approved or cleared by the [place] FDA and  has been authorized for detection and/or diagnosis of SARS-CoV-2 by FDA under an Emergency Use Authorization (EUA). This EUA will remain  in effect (meaning this test can be used) for the duration of the COVID-19 declaration under Section 564(b)(1) of the Act, 21 U.S.C.section 360bbb-3(b)(1), unless the authorization is terminated  or revoked sooner.       Influenza A by PCR NEGATIVE NEGATIVE Final   Influenza B by PCR NEGATIVE NEGATIVE Final    Comment: (NOTE) The Xpert Xpress SARS-CoV-2/FLU/RSV plus assay is intended as an aid in the diagnosis of influenza from Nasopharyngeal swab specimens and should not be used as a sole basis for treatment. Nasal washings and aspirates are unacceptable for Xpert Xpress SARS-CoV-2/FLU/RSV testing.  Fact Sheet for Patients: EntrepreneurPulse.com.au  Fact Sheet for Healthcare Providers: IncredibleEmployment.be  This test is not yet approved or cleared by the [place] FDA and has been authorized for detection and/or diagnosis of SARS-CoV-2 by FDA under an Emergency Use Authorization (EUA). This EUA will remain in effect (meaning this test can be used) for the duration of the COVID-19 declaration under Section 564(b)(1) of the Act, 21 U.S.C. section 360bbb-3(b)(1), unless the authorization is terminated or revoked.  Performed at [facility], [address], [place], [place] [address]     Radiology Studies: No results found.    Jocelyn Foster  If 7PM-7AM, please contact  night-coverage [email or web address] 10/23/2021, 2:38 PM

## 2021-10-24 DIAGNOSIS — L0101 Non-bullous impetigo: Secondary | ICD-10-CM

## 2021-10-24 DIAGNOSIS — L03114 Cellulitis of left upper limb: Secondary | ICD-10-CM | POA: Diagnosis not present

## 2021-10-24 DIAGNOSIS — R110 Nausea: Secondary | ICD-10-CM

## 2021-10-24 NOTE — Progress Notes (Addendum)
°    [facility]    Date of Admission:  10/20/2021   Total days of antibiotics 6/linezolid and ceftriaxone          ID: Jocelyn Foster is a 50 y.o. female with  left hand/arm cellulitis with lymphangitis and pustular rash. Contact with group a strep Principal Problem:   Cellulitis of arm Active Problems:   Cellulitis of finger of left hand    Subjective: Having occ headache and nausea. Improvement with rash. Noticing scattered small pustules  Medications:   acidophilus  2 capsule Oral TID   emtricitabine-tenofovir  1 tablet Oral Daily   linezolid  600 mg Oral Q12H   liothyronine  5 mcg Oral Daily   thyroid  30 mg Oral q morning    Objective: Vital signs in last 24 hours: Temp:  [97.9 F (36.6 C)-98.4 F (36.9 C)] 97.9 F (36.6 C) (12/24 0809) Pulse Rate:  [72-89] 72 (12/24 0809) Resp:  [18-20] 18 (12/24 0809) BP: (116-123)/(72-85) 116/78 (12/24 0809) SpO2:  [96 %-99 %] 98 % (12/24 0809)  Physical Exam  Constitutional:  oriented to person, place, and time. appears well-developed and well-nourished. No distress.  HENT: [place]/AT, PERRLA, no scleral icterus Mouth/Throat: Oropharynx is clear and moist. No oropharyngeal exudate.  Cardiovascular: Normal rate, regular rhythm and normal heart sounds. Exam reveals no gallop and no friction rub.  No murmur heard.  Pulmonary/Chest: Effort normal and breath sounds normal. No respiratory distress.  has no wheezes.  Neck = supple, no nuchal rigidity Ex: left arm erythema, patchy with raised edges. Areas of blanching Lymphadenopathy: no cervical adenopathy. No axillary adenopathy Neurological: alert and oriented to person, place, and time.  Skin: Skin is warm and dry. No rash noted. No erythema.  Psychiatric: a normal mood and affect.  behavior is normal.    Lab Results Recent Labs    10/23/21 0801  WBC 8.5  HGB 12.9  HCT 37.5  NA 137  K 4.2  CL 105  CO2 26  BUN 8  CREATININE 0.88   Liver  Panel Recent Labs    10/23/21 0801  ALBUMIN 2.8*   Sedimentation Rate No results for input(s): ESRSEDRATE in the last 72 hours. C-Reactive Protein No results for input(s): CRP in the last 72 hours.  Microbiology: reviewed Studies/Results: No results found.   Assessment/Plan: Probable streptococcal cellulitis with lymphangitis nad scattered bullous impetigo/pustule s= continue with ceftriaxone and linezolid today. Can narrow to linezolid 600mg  PO BID for 7 addn days- discharge tomorrow.  PrEP = continue on emtricitabine-TAF  Nausea = can consider discontinuing probiotics to see if reduction in pill burden also might help her symptoms.   [facility] Infectious Diseases Pager: [phone]  10/24/2021, 1:13 PM

## 2021-10-24 NOTE — Progress Notes (Signed)
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  She had I&D at local urgent care 3 days prior, and started on p.o. Keflex.  However, swelling and pain persisted, and she developed progressive erythematous skin rash in his arm that prompted her to come to ED.  She was afebrile and hemodynamically stable.  No leukocytosis.  Started on IV vancomycin and Keflex.  Blood cultures NGTD.  MRI of her right hand with low-grade infiltrative edema in the flexor pollicis brevis and adductor pollicis muscle compatible with low grade myositis; and infiltrative subcutaneous edema along the dorsum of the hand with no drainable abscess or osteomyelitis.  Infectious disease consulted.  Changed antibiotic to Zyvox and ceftriaxone.  Likely home on 10/25/2021.  Subjective: Seen and examined earlier this morning.  No major events overnight or this morning.  Swelling, erythema and skin rash seems to be improving.  She is complaining of dull headache.  No neck stiffness or focal neuro symptoms.  Objective: Vitals:   10/23/21 1652 10/23/21 2029 10/24/21 0400 10/24/21 0809  BP: 117/85 123/74 122/72 116/78  Pulse: 83 89 86 72  Resp: _0 Temp: 98.4 F (36.9 C) 98.2 F (36.8 C) 98.3 F (36.8 C) 97.9 F (36.6 C)  TempSrc: Oral Oral Oral Oral  SpO2: 99% 96% 98% 98%  Weight:      Height:        Examination:  GENERAL: No apparent distress.  Nontoxic. HEENT: MMM.  Vision and hearing grossly intact.  NECK: Supple.  No apparent JVD.  RESP:  No IWOB.  Fair aeration bilaterally. CVS:  RRR. Heart sounds normal.   ABD/GI/GU: BS+. Abd soft, NTND.  MSK/EXT:  Moves extremities. No apparent deformity. No edema.  Slight LLE swelling. SKIN: LLE erythema slightly expanding peripherally but lighter and clearing centrally.  Erythematous macules on the back.  Pustules left cheek, right wrist and legs but improved. NEURO: Awake and alert. Oriented appropriately.  No apparent focal neuro deficit. PSYCH: Calm. Normal affect.    Procedures:  None  Microbiology summarized: [id number] and influenza PCR nonreactive. Blood cultures NGTD.  Assessment & Plan: Left hand cellulitis/Lymphangitis/pustular rash:patient had cut to left hand while opening on 7 days prior.  No improvement despite I&D at urgent care and oral antibiotics (p.o. Keflex).  I&D culture from 12/19 NGTD but gram stain with rare GPC's. CRP and ESR 6.9 and 13 respectively suggesting acute inflammation/infection.  Slight progression in her erythema overnight.  Otherwise, seems to be stable.  Blood cultures NGTD.  RPR and ASO negative.  MRI as above.  Improving. -Appreciate guidance by ID-on Zyvox and IV ceftriaxone in-house -Discharge on p.o. Zyvox 600 mg for 7 more days starting 10/25/2021.  ID sent Rx to pharmacy. -Encourage arm elevation   HIV prophylaxis-RPR negative. -Continue Truvada   Hypothyroid -Continue Synthroid.  Tension headache: No red flags. -As needed Tylenol.  Class I obesity Body mass index is 30.45 kg/m.         DVT prophylaxis:  SCDs Start: 10/20/21 1942  Code Status: Full code Family Communication: Updated patient's  husband at bedside  Level of care: Med-Surg Status is: Inpatient  Remains inpatient appropriate because: Due to need for IV antibiotics and further evaluation of left hand and arm infection   Final disposition: Likely home on 10/25/2021    Consultants:  Infectious disease   Sch Meds:  Scheduled Meds:  acidophilus  2 capsule Oral TID   emtricitabine-tenofovir  1 tablet Oral Daily   linezolid   600 mg Oral Q12H   liothyronine  5 mcg Oral Daily   thyroid  30 mg Oral q morning   Continuous Infusions:  cefTRIAXone (ROCEPHIN)  IV 2 g (10/24/21 1243)   PRN Meds:.acetaminophen **OR** acetaminophen, ketorolac, ondansetron **OR** ondansetron (ZOFRAN) IV  Antimicrobials: Anti-infectives (From admission, onward)    Start     Dose/Rate Route Frequency Ordered Stop   10/22/21 2200  linezolid (ZYVOX) IVPB 600 mg  Status:  Discontinued        600 mg 300 mL/hr over 60 Minutes Intravenous Every 12 hours 10/22/21 1110 10/22/21 1122   10/22/21 2200  linezolid (ZYVOX) tablet 600 mg        600 mg Oral Every 12 hours 10/22/21 1122     10/22/21 1200  cefTRIAXone (ROCEPHIN) 2 g in sodium chloride 0.9 % 100 mL IVPB        2 g 200 mL/hr over 30 Minutes Intravenous Every 24 hours 10/22/21 1110     10/22/21 1000  emtricitabine-tenofovir (TRUVADA) 200-300 MG per tablet 1 tablet        1 tablet Oral Daily 10/21/21 1358     10/21/21 1400  clindamycin (CLEOCIN) IVPB 600 mg  Status:  Discontinued        600 mg 100 mL/hr over 30 Minutes Intravenous Every 8 hours 10/21/21 0914 10/22/21 1110   10/21/21 1000  vancomycin (VANCOREADY) IVPB 1250 mg/250 mL  Status:  Discontinued        1,250 mg 166.7 mL/hr over 90 Minutes Intravenous Every 24 hours 10/20/21 1249 10/22/21 1110   10/20/21 2200  ceFAZolin (ANCEF) IVPB 1 g/50 mL premix  Status:  Discontinued        1 g 100 mL/hr over 30 Minutes Intravenous Every 8 hours 10/20/21 1923 10/21/21 0914   10/20/21 2030  emtricitabine-tenofovir AF (DESCOVY) 200-25 MG per tablet 1 tablet  Status:  Discontinued        1 tablet Oral Daily 10/20/21 1942 10/21/21 1358   10/20/21 1300  vancomycin (VANCOCIN) IVPB 1000 mg/200 mL premix        1,000 mg 200 mL/hr over 60 Minutes Intravenous STAT 10/20/21 1249 10/20/21 1415   10/20/21 1200  vancomycin (VANCOREADY) IVPB 1250 mg/250 mL  Status:  Discontinued        1,250 mg 166.7 mL/hr over 90 Minutes Intravenous Every 24 hours  10/20/21 1158 10/20/21 1249        I have personally reviewed the following labs and images: CBC: Recent Labs  Lab 10/19/21 0943 10/20/21 1130 10/23/21 0801  WBC 6.2 5.8 8.5  NEUTROABS  --  2.6 2.5  HGB 14.4 13.4 12.9  HCT 42.8 40.0 37.5  MCV 91.5 91.1 91.0  PLT 148* 154 197   BMP &GFR Recent Labs  Lab 10/19/21 0943 10/20/21 1130 10/23/21 0801  NA 135 136 137  K 3.6 3.6 4.2  CL 100 102 105  CO2 _0 GLUCOSE 113* 106* 105*  BUN _1 CREATININE 0.91 0.82 0.88  CALCIUM 8.9 8.3* 8.3*  MG  --   --  2.0  PHOS  --   --  2.8   Estimated Creatinine Clearance: 81.4 mL/min (by C-G formula based on SCr of 0.88 mg/dL). Liver & Pancreas: Recent Labs  Lab 10/23/21 0801  ALBUMIN 2.8*   No results for input(s): LIPASE, AMYLASE in the last 168 hours. No results for input(s): AMMONIA in the last 168 hours. Diabetic: No results for input(s): HGBA1C in the last 72 hours. No results for input(s): GLUCAP in the last 168 hours. Cardiac Enzymes: No results for input(s): CKTOTAL, CKMB, CKMBINDEX, TROPONINI in the last 168 hours. No results for input(s): PROBNP in the last 8760 hours. Coagulation Profile: No results for input(s): INR, PROTIME in the last 168 hours. Thyroid Function Tests: No results for input(s): TSH, T4TOTAL, FREET4, T3FREE, THYROIDAB in the last 72 hours. Lipid Profile: No results for input(s): CHOL, HDL, LDLCALC, TRIG, CHOLHDL, LDLDIRECT in the last 72 hours. Anemia Panel: No results for input(s): VITAMINB12, FOLATE, FERRITIN, TIBC, IRON, RETICCTPCT in the last 72 hours. Urine analysis: No results found for: COLORURINE, APPEARANCEUR, LABSPEC, PHURINE, GLUCOSEU, HGBUR, BILIRUBINUR, KETONESUR, PROTEINUR, UROBILINOGEN, NITRITE, LEUKOCYTESUR Sepsis Labs: Invalid input(s): PROCALCITONIN, [place]  Microbiology: Recent Results (from the past 240 hour(s))  Aerobic Culture w Gram Stain (superficial specimen)     Status: None   Collection Time: 10/19/21  10:54 AM   Specimen: Wound  Result Value Ref Range Status   Specimen Description WOUND  Final   Special Requests LEFT HAND  Final   Gram Stain   Final    FEW WBC PRESENT,BOTH PMN AND MONONUCLEAR RARE GRAM POSITIVE COCCI    Culture   Final    NO GROWTH 2 DAYS Performed at [facility], [address] [place], [place] [address]    Report Status 10/22/2021 FINAL  Final  Culture, blood (routine x 2)     Status: None (Preliminary result)   Collection Time: 10/20/21 11:30 AM   Specimen: Right Antecubital; Blood  Result Value Ref Range Status   Specimen Description   Final    RIGHT ANTECUBITAL Performed at [facility], [address], [place], [place] [address]    Special Requests   Final    BOTTLES DRAWN AEROBIC AND ANAEROBIC Blood Culture adequate volume Performed at [facility], [address], [place], [place] [address]    Culture   Final    NO GROWTH 4 DAYS Performed at [facility], [place] [address], [place],  [address]    Report Status PENDING  Incomplete  Resp Panel by RT-PCR (Flu A&B, Covid) Nasopharyngeal Swab     Status: None   Collection Time: 10/20/21 11:30 AM   Specimen: Nasopharyngeal Swab; Nasopharyngeal(NP) swabs in vial transport medium  Result Value Ref Range Status   SARS Coronavirus 2 by RT PCR NEGATIVE NEGATIVE Final    Comment: (NOTE) SARS-CoV-2 target nucleic acids are NOT DETECTED.  The SARS-CoV-2 RNA is generally detectable in upper respiratory specimens during the acute phase of infection. The lowest concentration of SARS-CoV-2 viral copies this assay can detect is 138 copies/mL. A negative result does not preclude SARS-Cov-2 infection and should not be used as the sole basis for treatment or other patient management decisions. A negative result may occur with  improper specimen collection/handling, submission of specimen other than nasopharyngeal swab, presence of viral mutation(s) within  the areas targeted by this assay, and inadequate number of viral copies(<138 copies/mL). A negative result must be combined with clinical observations, patient history, and epidemiological information. The  expected result is Negative.  Fact Sheet for Patients:  EntrepreneurPulse.com.au  Fact Sheet for Healthcare Providers:  IncredibleEmployment.be  This test is no t yet approved or cleared by the [place] FDA and  has been authorized for detection and/or diagnosis of SARS-CoV-2 by FDA under an Emergency Use Authorization (EUA). This EUA will remain  in effect (meaning this test can be used) for the duration of the COVID-19 declaration under Section 564(b)(1) of the Act, 21 U.S.C.section 360bbb-3(b)(1), unless the authorization is terminated  or revoked sooner.       Influenza A by PCR NEGATIVE NEGATIVE Final   Influenza B by PCR NEGATIVE NEGATIVE Final    Comment: (NOTE) The Xpert Xpress SARS-CoV-2/FLU/RSV plus assay is intended as an aid in the diagnosis of influenza from Nasopharyngeal swab specimens and should not be used as a sole basis for treatment. Nasal washings and aspirates are unacceptable for Xpert Xpress SARS-CoV-2/FLU/RSV testing.  Fact Sheet for Patients: EntrepreneurPulse.com.au  Fact Sheet for Healthcare Providers: IncredibleEmployment.be  This test is not yet approved or cleared by the [place] FDA and has been authorized for detection and/or diagnosis of SARS-CoV-2 by FDA under an Emergency Use Authorization (EUA). This EUA will remain in effect (meaning this test can be used) for the duration of the COVID-19 declaration under Section 564(b)(1) of the Act, 21 U.S.C. section 360bbb-3(b)(1), unless the authorization is terminated or revoked.  Performed at [facility], [address] [place], [place] [address]     Radiology Studies: No results  found.    Pennie Vanblarcom T. [place]  If 7PM-7AM, please contact night-coverage [email or web address] 10/24/2021, 3:39 PM

## 2021-10-25 LAB — CULTURE, BLOOD (ROUTINE X 2)
Culture: NO GROWTH
Special Requests: ADEQUATE

## 2021-10-25 MED ORDER — ONDANSETRON HCL 4 MG PO TABS
4.0000 mg | ORAL_TABLET | Freq: Three times a day (TID) | ORAL | 0 refills | Status: DC | PRN
Start: 2021-10-25 — End: 2021-11-03

## 2021-10-25 MED ORDER — SULFAMETHOXAZOLE-TRIMETHOPRIM 800-160 MG PO TABS: 1.0000 | ORAL_TABLET | Freq: Two times a day (BID) | ORAL | 0 refills | Status: AC

## 2021-10-25 MED ORDER — LINEZOLID 600 MG PO TABS: 600.0000 mg | ORAL_TABLET | Freq: Two times a day (BID) | ORAL | 0 refills | Status: DC

## 2021-10-25 MED ORDER — AMOXICILLIN 500 MG PO TABS: 1000.0000 mg | ORAL_TABLET | Freq: Three times a day (TID) | ORAL | 0 refills | Status: AC

## 2021-10-25 NOTE — Progress Notes (Signed)
°  Transition of Care [facility]) Screening Note   Patient Details  Name: Jocelyn Foster Date of Birth: May 03, 1971   Transition of Care [facility]) CM/SW Contact:    Bess Kinds, RN Phone Number: [phone] 10/25/2021, 10:37 AM    Transition of Care Department [facility]) has reviewed patient and no TOC needs have been identified at this time. We will continue to monitor patient advancement through interdisciplinary progression rounds. If new patient transition needs arise, please place a TOC consult.

## 2021-10-25 NOTE — Discharge Summary (Addendum)
Physician Discharge Summary  Jocelyn Foster VZC:[id number] DOB: 18-Mar-1971 DOA: 10/20/2021  PCP: Merryl Hacker, No  Admit date: 10/20/2021 Discharge date: 10/25/2021  Admitted From: Home Disposition: Home  Recommendations for Outpatient Follow-up:  Follow up with PCP in 1-2 weeks Please obtain BMP/CBC in one week Follow-up with ID Dr. 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  She had I&D at local urgent care 3 days prior, and started on p.o. Keflex.  However, swelling and pain persisted, and she developed progressive erythematous skin rash in his arm that prompted her to come to ED.  She was afebrile and hemodynamically stable.  No leukocytosis.  Started on IV vancomycin and Keflex.   Blood cultures NGTD.  MRI of her right hand with low-grade infiltrative edema in the flexor pollicis brevis and adductor pollicis muscle compatible with low grade myositis; and infiltrative subcutaneous edema along the dorsum of the hand with no drainable abscess or osteomyelitis. Infectious disease consulted.  Changed antibiotic to Zyvox and ceftriaxone.    Left hand cellulitis: -patient had cut to left hand while opening a car 7 days prior.  No improvement despite I&D at urgent care and oral antibiotics (p.o. Keflex).   -MRI as above -I&D culture from 12/19 NGTD but gram stain with rare GPC's. CRP and ESR 6.9 and 13 respectively suggesting acute inflammation/infection.  -Blood cultures NGTD.  RPR and ASO negative.  MRI as above.  Improving. -Appreciate guidance by ID-on Zyvox and IV ceftriaxone during hospitalization -Due to cost issues with  Zyvox-discussed with ID -we will discharge patient on amoxicillin 1 g 3 times daily and Bactrim DS twice daily for 7 days. -Encourage arm elevation -Follow-up with ID in 1 week   HIV prophylaxis-RPR negative. -Continue Truvada   Hypothyroid -Continue Synthroid.   Tension headache: No red flags. -As needed Tylenol.   Class I obesity Body mass index is 30.45 kg/m.  Nausea: Discharged on Zofran as needed  Discharge Diagnoses:  Left hand cellulitis HIV prophylaxis Hypothyroidism Tension headache Obesity with BMI of 30 Nausea    Discharge Instructions  Discharge Instructions     Diet general   Complete by: As directed    Increase activity slowly   Complete by: As directed       Allergies as of 10/25/2021       Reactions   Erythromycin Nausea And Vomiting        Medication List     STOP taking these medications    cephALEXin 500 MG capsule Commonly known as: KEFLEX   doxycycline 100 MG tablet Commonly known as: VIBRA-TABS       TAKE these medications    amoxicillin 500 MG tablet Commonly known as: AMOXIL Take 2 tablets (1,000 mg total) by mouth in the morning, at noon, and at bedtime for 7 days.   Armour Thyroid 30 MG tablet Generic drug: thyroid Take 30 mg by mouth every morning.   liothyronine 5 MCG tablet Commonly known as: CYTOMEL Take 5-10 mcg by mouth daily.   ondansetron 4 MG tablet Commonly known as: ZOFRAN Take 1 tablet (4 mg total) by mouth every 8 (eight) hours as needed for  nausea.   sulfamethoxazole-trimethoprim 800-160 MG tablet Commonly known as: BACTRIM DS Take 1 tablet by mouth 2 (two) times daily for 7 days.   Truvada 200-300 MG tablet Generic drug: emtricitabine-tenofovir Take 1 tablet by mouth daily.        Follow-up Information     Carlyle Basques, MD Follow up in 1 week(s).   Specialty: Infectious Diseases Contact information: [address] [place] [address] [phone]                 Allergies  Allergen Reactions   Erythromycin Nausea And Vomiting    Consultations: ID   Procedures/Studies: MR HAND LEFT W WO CONTRAST  Result Date: 10/21/2021 CLINICAL DATA:  Left hand swelling and pain, possible osteomyelitis. Laceration along the thenar region 7 days prior to imaging. Incision and drainage 3 days ago. Fever and chills. Chronic HIV. EXAM: MRI OF THE LEFT HAND WITHOUT AND WITH CONTRAST TECHNIQUE: Multiplanar, multisequence MR imaging of the left hand was performed before and after the administration of intravenous contrast. CONTRAST:  3mL GADAVIST GADOBUTROL 1 MMOL/ML IV SOLN COMPARISON:  None. FINDINGS: Bones/Joint/Cartilage No findings of osteomyelitis. No substantial joint effusion to indicate septic arthritis. Ligaments Today's exam was not focused on a specific joint and large field of view images reduce sensitivity for ligamentous assessment. No obvious ligamentous derangement is observed. Muscles and Tendons Mild edema signal and enhancement infiltrating the superficial head of the flexor pollicis brevis and the adductor pollicis muscles for example on image 13 series 6 and image 12 series 6. There may also be some extension in deep pad of the flexor pollicis brevis. No intramuscular abscess is identified. Soft tissues Subcutaneous edema in the hand notable along the thenar region and also dorsally in the hand and tracking into the dorsal fingers, but without drainable abscess. No visible gas in the soft tissues. IMPRESSION: 1. Low-grade infiltrative edema signal in the flexor pollicis brevis and adductor pollicis muscles compatible with low-grade myositis. There is also infiltrative subcutaneous edema along the thenar region and dorsum of the hand. No drainable abscess. No osteomyelitis identified. Electronically Signed   By: Van Clines M.D.   On: 10/21/2021 16:31      Subjective: Patient seen and examined.  Resting comfortably on the bed.  Husband at  the bedside.  Patient tells me that she feels better this morning.  No fever, chills overnight.  Has some headache for which she has been taking Tylenol.  Has nausea due to antibiotics and requested for Zofran prescription.  She is comfortable going home today.  Willing to follow-up with PCP and ID.  Discharge Exam: Vitals:   10/25/21 0430 10/25/21 0757  BP: 114/68 107/72  Pulse: 84 79  Resp: 19 19  Temp: 98.1 F (36.7 C) 97.6 F (36.4 C)  SpO2: 92% 98%   Vitals:   10/24/21 1724 10/24/21 2105 10/25/21 0430 10/25/21 0757  BP: 121/77 116/84 114/68 107/72  Pulse: 83 86 84 79  Resp: $Remo'18 19 19 19  'FkEhI$ Temp: 97.9 F (36.6 C) 97.8 F (36.6 C) 98.1 F (36.7 C) 97.6 F (36.4 C)  TempSrc: Oral Oral Oral Oral  SpO2: 97% 98% 92% 98%  Weight:      Height:        General: Pt is alert, awake, not in acute distress Cardiovascular: RRR, S1/S2 +, no rubs, no gallops Respiratory: CTA bilaterally, no wheezing, no rhonchi Abdominal: Soft, NT, ND, bowel sounds + Extremities:  left arm: Patchy erythematous Left  hand: Well-healing wound between thumb and index finger.  No exudate or active bleeding noted.  The results of significant diagnostics from this hospitalization (including imaging, microbiology, ancillary and laboratory) are listed below for reference.     Microbiology: Recent Results (from the past 240 hour(s))  Aerobic Culture w Gram Stain (superficial specimen)     Status: None   Collection Time: 10/19/21 10:54 AM   Specimen: Wound  Result Value Ref Range Status   Specimen Description WOUND  Final   Special Requests LEFT HAND  Final   Gram Stain   Final    FEW WBC PRESENT,BOTH PMN AND MONONUCLEAR RARE GRAM POSITIVE COCCI    Culture   Final    NO GROWTH 2 DAYS Performed at [facility], [address], [place], [place] [address]    Report Status 10/22/2021 FINAL  Final  Culture, blood (routine x 2)     Status: None (Preliminary result)   Collection Time: 10/20/21 11:30 AM    Specimen: Right Antecubital; Blood  Result Value Ref Range Status   Specimen Description   Final    RIGHT ANTECUBITAL Performed at [facility], [address] [place], [place] [address]    Special Requests   Final    BOTTLES DRAWN AEROBIC AND ANAEROBIC Blood Culture adequate volume Performed at [facility], [address], [place], [place] [address]    Culture   Final    NO GROWTH 4 DAYS Performed at [facility], [place] [address], [place], [place] [address]    Report Status PENDING  Incomplete  Resp Panel by RT-PCR (Flu A&B, Covid) Nasopharyngeal Swab     Status: None   Collection Time: 10/20/21 11:30 AM   Specimen: Nasopharyngeal Swab; Nasopharyngeal(NP) swabs in vial transport medium  Result Value Ref Range Status   SARS Coronavirus 2 by RT PCR NEGATIVE NEGATIVE Final    Comment: (NOTE) SARS-CoV-2 target nucleic acids are NOT DETECTED.  The SARS-CoV-2 RNA is generally detectable in upper respiratory specimens during the acute phase of infection. The lowest concentration of SARS-CoV-2 viral copies this assay can detect is 138 copies/mL. A negative result does not preclude SARS-Cov-2 infection and should not be used as the sole basis for treatment or other patient management decisions. A negative result may occur with  improper specimen collection/handling, submission of specimen other than nasopharyngeal swab, presence of viral mutation(s) within the areas targeted by this assay, and inadequate number of viral copies(<138 copies/mL). A negative result must be combined with clinical observations, patient history, and epidemiological information. The expected result is Negative.  Fact Sheet for Patients:  EntrepreneurPulse.com.au  Fact Sheet for Healthcare Providers:  IncredibleEmployment.be  This test is no t yet approved or cleared by the [place] FDA and  has been authorized for  detection and/or diagnosis of SARS-CoV-2 by FDA under an Emergency Use Authorization (EUA). This EUA will remain  in effect (meaning this test can be used) for the duration of the COVID-19 declaration under Section 564(b)(1) of the Act, 21 U.S.C.section 360bbb-3(b)(1), unless the authorization is terminated  or revoked sooner.       Influenza A by PCR NEGATIVE NEGATIVE Final   Influenza B by PCR NEGATIVE NEGATIVE Final    Comment: (NOTE) The Xpert Xpress SARS-CoV-2/FLU/RSV plus assay is intended as an aid in the diagnosis of influenza from Nasopharyngeal swab specimens and should not be used as a sole basis for treatment. Nasal washings and aspirates are unacceptable for Xpert  Xpress SARS-CoV-2/FLU/RSV testing.  Fact Sheet for Patients: EntrepreneurPulse.com.au  Fact Sheet for Healthcare Providers: IncredibleEmployment.be  This test is not yet approved or cleared by the [place] FDA and has been authorized for detection and/or diagnosis of SARS-CoV-2 by FDA under an Emergency Use Authorization (EUA). This EUA will remain in effect (meaning this test can be used) for the duration of the COVID-19 declaration under Section 564(b)(1) of the Act, 21 U.S.C. section 360bbb-3(b)(1), unless the authorization is terminated or revoked.  Performed at [facility], [address], [place], [place] [address]      Labs: BNP (last 3 results) No results for input(s): BNP in the last 8760 hours. Basic Metabolic Panel: Recent Labs  Lab 10/19/21 0943 10/20/21 1130 10/23/21 0801  NA 135 136 137  K 3.6 3.6 4.2  CL 100 102 105  CO2 $Re'29 26 26  'dVC$ GLUCOSE 113* 106* 105*  BUN $Re'15 15 8  'rWD$ CREATININE 0.91 0.82 0.88  CALCIUM 8.9 8.3* 8.3*  MG  --   --  2.0  PHOS  --   --  2.8   Liver Function Tests: Recent Labs  Lab 10/23/21 0801  ALBUMIN 2.8*   No results for input(s): LIPASE, AMYLASE in the last 168 hours. No results for  input(s): AMMONIA in the last 168 hours. CBC: Recent Labs  Lab 10/19/21 0943 10/20/21 1130 10/23/21 0801  WBC 6.2 5.8 8.5  NEUTROABS  --  2.6 2.5  HGB 14.4 13.4 12.9  HCT 42.8 40.0 37.5  MCV 91.5 91.1 91.0  PLT 148* 154 197   Cardiac Enzymes: No results for input(s): CKTOTAL, CKMB, CKMBINDEX, TROPONINI in the last 168 hours. BNP: Invalid input(s): POCBNP CBG: No results for input(s): GLUCAP in the last 168 hours. D-Dimer No results for input(s): DDIMER in the last 72 hours. Hgb A1c No results for input(s): HGBA1C in the last 72 hours. Lipid Profile No results for input(s): CHOL, HDL, LDLCALC, TRIG, CHOLHDL, LDLDIRECT in the last 72 hours. Thyroid function studies No results for input(s): TSH, T4TOTAL, T3FREE, THYROIDAB in the last 72 hours.  Invalid input(s): FREET3 Anemia work up No results for input(s): VITAMINB12, FOLATE, FERRITIN, TIBC, IRON, RETICCTPCT in the last 72 hours. Urinalysis No results found for: COLORURINE, APPEARANCEUR, [place], [place], GLUCOSEU, [place], [place], [place], PROTEINUR, UROBILINOGEN, NITRITE, LEUKOCYTESUR Sepsis Labs Invalid input(s): PROCALCITONIN,  WBC,  LACTICIDVEN Microbiology Recent Results (from the past 240 hour(s))  Aerobic Culture w Gram Stain (superficial specimen)     Status: None   Collection Time: 10/19/21 10:54 AM   Specimen: Wound  Result Value Ref Range Status   Specimen Description WOUND  Final   Special Requests LEFT HAND  Final   Gram Stain   Final    FEW WBC PRESENT,BOTH PMN AND MONONUCLEAR RARE GRAM POSITIVE COCCI    Culture   Final    NO GROWTH 2 DAYS Performed at [facility], [place] [address], [place], [place] [address]    Report Status 10/22/2021 FINAL  Final  Culture, blood (routine x 2)     Status: None (Preliminary result)   Collection Time: 10/20/21 11:30 AM   Specimen: Right Antecubital; Blood  Result Value Ref Range Status   Specimen Description   Final    RIGHT ANTECUBITAL Performed at  [facility], [address] [place], [place] [address]    Special Requests   Final    BOTTLES DRAWN AEROBIC AND ANAEROBIC Blood Culture adequate volume Performed at [facility], [phone]  [address], [place], [place] [address]    Culture   Final    NO GROWTH 4 DAYS Performed at [facility], [place] [address], [place], [place] [address]    Report Status PENDING  Incomplete  Resp Panel by RT-PCR (Flu A&B, Covid) Nasopharyngeal Swab     Status: None   Collection Time: 10/20/21 11:30 AM   Specimen: Nasopharyngeal Swab; Nasopharyngeal(NP) swabs in vial transport medium  Result Value Ref Range Status   SARS Coronavirus 2 by RT PCR NEGATIVE NEGATIVE Final    Comment: (NOTE) SARS-CoV-2 target nucleic acids are NOT DETECTED.  The SARS-CoV-2 RNA is generally detectable in upper respiratory specimens during the acute phase of infection. The lowest concentration of SARS-CoV-2 viral copies this assay can detect is 138 copies/mL. A negative result does not preclude SARS-Cov-2 infection and should not be used as the sole basis for treatment or other patient management decisions. A negative result may occur with  improper specimen collection/handling, submission of specimen other than nasopharyngeal swab, presence of viral mutation(s) within the areas targeted by this assay, and inadequate number of viral copies(<138 copies/mL). A negative result must be combined with clinical observations, patient history, and epidemiological information. The expected result is Negative.  Fact Sheet for Patients:  EntrepreneurPulse.com.au  Fact Sheet for Healthcare Providers:  IncredibleEmployment.be  This test is no t yet approved or cleared by the [place] FDA and  has been authorized for detection and/or diagnosis of SARS-CoV-2 by FDA under an Emergency Use Authorization (EUA). This EUA will remain  in effect (meaning this  test can be used) for the duration of the COVID-19 declaration under Section 564(b)(1) of the Act, 21 U.S.C.section 360bbb-3(b)(1), unless the authorization is terminated  or revoked sooner.       Influenza A by PCR NEGATIVE NEGATIVE Final   Influenza B by PCR NEGATIVE NEGATIVE Final    Comment: (NOTE) The Xpert Xpress SARS-CoV-2/FLU/RSV plus assay is intended as an aid in the diagnosis of influenza from Nasopharyngeal swab specimens and should not be used as a sole basis for treatment. Nasal washings and aspirates are unacceptable for Xpert Xpress SARS-CoV-2/FLU/RSV testing.  Fact Sheet for Patients: EntrepreneurPulse.com.au  Fact Sheet for Healthcare Providers: IncredibleEmployment.be  This test is not yet approved or cleared by the [place] FDA and has been authorized for detection and/or diagnosis of SARS-CoV-2 by FDA under an Emergency Use Authorization (EUA). This EUA will remain in effect (meaning this test can be used) for the duration of the COVID-19 declaration under Section 564(b)(1) of the Act, 21 U.S.C. section 360bbb-3(b)(1), unless the authorization is terminated or revoked.  Performed at [facility], [address], [place], [place] [address]      Time coordinating discharge: Over 30 minutes  SIGNED:   Mckinley Jewel, MD  [facility] 10/25/2021, 1:44 PM Pager   If 7PM-7AM, please contact night-coverage [email or web address]

## 2021-10-27 ENCOUNTER — Telehealth: Payer: Self-pay

## 2021-10-27 ENCOUNTER — Other Ambulatory Visit (HOSPITAL_COMMUNITY): Payer: Self-pay

## 2021-10-27 MED ORDER — METOCLOPRAMIDE HCL 10 MG PO TABS: 10.0000 mg | ORAL_TABLET | Freq: Four times a day (QID) | ORAL | 0 refills | Status: DC | PRN

## 2021-10-27 NOTE — Telephone Encounter (Signed)
Patient left voicemail stating she was seen in the hospital and put on amoxicillin and Bactrim. She is having nausea which is not relieved by the ondansetron she was prescribed. She is asking that an alternate medication be sent in. Will route to provider.   Sandie Ano, RN

## 2021-10-27 NOTE — Addendum Note (Signed)
Addended by: Jeanine Luz D on: 10/27/2021 05:15 PM   Modules accepted: Orders

## 2021-10-27 NOTE — Telephone Encounter (Signed)
Metoclopramide sent to pharmacy to see if that helps with nausea.   Marcos Eke, NP 10/27/2021 5:11 PM

## 2021-11-03 ENCOUNTER — Other Ambulatory Visit: Payer: Self-pay

## 2021-11-03 ENCOUNTER — Ambulatory Visit (INDEPENDENT_AMBULATORY_CARE_PROVIDER_SITE_OTHER): Payer: PRIVATE HEALTH INSURANCE | Admitting: Family

## 2021-11-03 ENCOUNTER — Encounter: Payer: Self-pay | Admitting: Family

## 2021-11-03 VITALS — BP 146/79 | HR 95 | Temp 97.6°F | Wt 186.4 lb

## 2021-11-03 DIAGNOSIS — L03012 Cellulitis of left finger: Secondary | ICD-10-CM

## 2021-11-03 DIAGNOSIS — L03114 Cellulitis of left upper limb: Secondary | ICD-10-CM | POA: Diagnosis not present

## 2021-11-03 NOTE — Patient Instructions (Addendum)
Nice to see you.  Continue with basic wound care with soap and water and cover with bandage.   No antibiotics are needed at present.   Follow up with Dr. Baxter Flattery on 11/16/20 at 9:30 am for wound check. Cancel if not needed.   Have a great day!

## 2021-11-03 NOTE — Assessment & Plan Note (Signed)
Ms. Passarella completed the course of amoxicillin and bactrim with resolution of her lymphangitis and cellulitis. Satellite lesions have mostly resolved with one or two with scabs remaining. No further antibiotics appear needed at this point and recommend continued wound care with soap, water and covering to protect it. Will arrange follow up in 2 weeks for a wound check if needed.

## 2021-11-03 NOTE — Progress Notes (Signed)
Subjective:    Patient ID: Jocelyn Foster, female    DOB: Oct 10, 1971, 52 y.o.   MRN: [id number]  Chief Complaint  Patient presents with   Hospitalization Follow-up    Cellulitis - improved. Hand wound - left hand - improving somewhat.     HPI:  Jocelyn Foster is a 51 y.o. female with previous medical history of hypothyroidism recently admitted to the hospital following a laceration to her left thenar webspace on 10/15/2021 while opening a can of carrots and status post I&D x2 and found to have lymphangitis and cellulitis of the left upper extremity.  Initial plan for Zyvox however this was cost prohibitive and was discharged on amoxicillin and Bactrim.  Experienced increased nausea while taking medication which improved with the addition of Reglan.  Here today for follow-up.  [name] completed her course of Bactrim and amoxicillin.  Continues with basic wound care.  She did hit her wound on 10/28/2021 and was instructed to continue with basic wound care.  Arm is feeling better and lesions are improving.  Does have fatigue.  Denies systemic symptoms of fevers and/or chills.   Allergies  Allergen Reactions   Erythromycin Nausea And Vomiting      Outpatient Medications Prior to Visit  Medication Sig Dispense Refill   ARMOUR THYROID 30 MG tablet Take 30 mg by mouth every morning.     emtricitabine-tenofovir (TRUVADA) 200-300 MG tablet Take 1 tablet by mouth daily. 30 tablet 5   liothyronine (CYTOMEL) 5 MCG tablet Take 5-10 mcg by mouth daily.     metoCLOPramide (REGLAN) 10 MG tablet Take 1 tablet (10 mg total) by mouth every 6 (six) hours as needed for nausea. (Patient not taking: Reported on 11/03/2021) 20 tablet 0   Multiple Vitamins-Minerals (MULTIVITAMIN WOMEN) TABS Take 1 tablet by mouth daily. (Patient not taking: Reported on 11/03/2021)     ondansetron (ZOFRAN) 4 MG tablet Take 1 tablet (4 mg total) by mouth every 8 (eight) hours as needed for nausea. (Patient not  taking: Reported on 11/03/2021) 20 tablet 0   No facility-administered medications prior to visit.     Past Medical History:  Diagnosis Date   Hypothyroidism      History reviewed. No pertinent surgical history.   Review of Systems  Constitutional:  Positive for fatigue. Negative for chills, diaphoresis and fever.  Respiratory:  Negative for cough, chest tightness, shortness of breath and wheezing.   Cardiovascular:  Negative for chest pain.  Gastrointestinal:  Negative for abdominal pain, diarrhea, nausea and vomiting.  Skin:  Positive for wound.     Objective:    BP (!) 146/79    Pulse 95    Temp 97.6 F (36.4 C) (Oral)    Wt 186 lb 6.4 oz (84.6 kg)    SpO2 97%    BMI 31.02 kg/m  Nursing note and vital signs reviewed.  Physical Exam Constitutional:      General: She is not in acute distress.    Appearance: She is well-developed.  Cardiovascular:     Rate and Rhythm: Normal rate and regular rhythm.     Heart sounds: Normal heart sounds.  Pulmonary:     Effort: Pulmonary effort is normal.     Breath sounds: Normal breath sounds.  Skin:    General: Skin is warm and dry.     Comments: Laceration remains present in the left thenar webspace. No drainage, induration or odor present.   Neurological:     Mental Status:  She is alert and oriented to person, place, and time.  Psychiatric:        Behavior: Behavior normal.        Thought Content: Thought content normal.        Judgment: Judgment normal.     No flowsheet data found.     Assessment & Plan:    Patient Active Problem List   Diagnosis Date Noted   Cellulitis of arm 10/20/2021   Cellulitis of finger of left hand 10/20/2021   High risk sexual behavior 02/17/2021     Problem List Items Addressed This Visit       Other   Cellulitis of arm    Ms. Duguay completed the course of amoxicillin and bactrim with resolution of her lymphangitis and cellulitis. Satellite lesions have mostly resolved with one or  two with scabs remaining. No further antibiotics appear needed at this point and recommend continued wound care with soap, water and covering to protect it. Will arrange follow up in 2 weeks for a wound check if needed.       Cellulitis of finger of left hand - Primary     I have discontinued Jocelyn Foster's ondansetron, metoCLOPramide, and Multivitamin Women. I am also having her maintain her emtricitabine-tenofovir, liothyronine, and Armour Thyroid.   Follow-up: Return in about 2 weeks (around 11/17/2021), or if symptoms worsen or fail to improve.   Marcos Eke, MSN, FNP-C Nurse Practitioner [facility] for Infectious Disease [facility] Main number: [phone]

## 2021-11-04 ENCOUNTER — Other Ambulatory Visit (HOSPITAL_COMMUNITY): Payer: Self-pay

## 2021-11-06 ENCOUNTER — Other Ambulatory Visit (HOSPITAL_COMMUNITY): Payer: Self-pay

## 2021-11-11 ENCOUNTER — Other Ambulatory Visit (HOSPITAL_COMMUNITY): Payer: Self-pay

## 2021-11-16 ENCOUNTER — Ambulatory Visit: Payer: PRIVATE HEALTH INSURANCE | Admitting: Internal Medicine

## 2021-11-23 ENCOUNTER — Other Ambulatory Visit (HOSPITAL_COMMUNITY): Payer: Self-pay

## 2021-11-23 ENCOUNTER — Telehealth: Payer: Self-pay

## 2021-11-23 NOTE — Telephone Encounter (Signed)
[facility] Patient Advocate Encounter  Completed and sent [employer] Advancing Access application for Truvada for this patient who is uninsured.    Patient is approved 11/20/21 through 11/20/22.  BIN      [id number] PCN    [id number] GRP    [id number] ID        [id number]   Clearance Coots, CPhT Specialty Pharmacy Patient [facility] for Infectious Disease Phone: [phone] Fax:  [phone]

## 2021-11-23 NOTE — Telephone Encounter (Signed)
I'm glad we got her approved!

## 2021-12-10 ENCOUNTER — Other Ambulatory Visit (HOSPITAL_COMMUNITY): Payer: Self-pay

## 2021-12-18 ENCOUNTER — Other Ambulatory Visit (HOSPITAL_COMMUNITY): Payer: Self-pay

## 2021-12-21 ENCOUNTER — Other Ambulatory Visit (HOSPITAL_COMMUNITY): Payer: Self-pay

## 2022-01-11 ENCOUNTER — Other Ambulatory Visit (HOSPITAL_COMMUNITY): Payer: Self-pay

## 2022-01-15 ENCOUNTER — Other Ambulatory Visit (HOSPITAL_COMMUNITY): Payer: Self-pay

## 2022-02-09 ENCOUNTER — Other Ambulatory Visit: Payer: Self-pay

## 2022-02-09 ENCOUNTER — Other Ambulatory Visit (HOSPITAL_COMMUNITY): Payer: Self-pay

## 2022-02-09 ENCOUNTER — Ambulatory Visit (INDEPENDENT_AMBULATORY_CARE_PROVIDER_SITE_OTHER): Payer: Self-pay | Admitting: Pharmacist

## 2022-02-09 DIAGNOSIS — Z79899 Other long term (current) drug therapy: Secondary | ICD-10-CM

## 2022-02-09 NOTE — Progress Notes (Signed)
? ?Date:  02/09/2022  ? ?HPI: Jocelyn Foster is a 51 y.o. female who presents to the [facility] clinic for HIV PrEP follow-up. ? ?Insured   []    Uninsured  [x]   ? ?Patient Active Problem List  ? Diagnosis Date Noted  ? Cellulitis of arm 10/20/2021  ? Cellulitis of finger of left hand 10/20/2021  ? High risk sexual behavior 02/17/2021  ? ? ?Patient's Medications  ?New Prescriptions  ? No medications on file  ?Previous Medications  ? ARMOUR THYROID 30 MG TABLET    Take 30 mg by mouth every morning.  ? EMTRICITABINE-TENOFOVIR (TRUVADA) 200-300 MG TABLET    Take 1 tablet by mouth daily.  ? LIOTHYRONINE (CYTOMEL) 5 MCG TABLET    Take 5-10 mcg by mouth daily.  ?Modified Medications  ? No medications on file  ?Discontinued Medications  ? No medications on file  ? ? ?Allergies: ?Allergies  ?Allergen Reactions  ? Erythromycin Nausea And Vomiting  ? ? ?Past Medical History: ?Past Medical History:  ?Diagnosis Date  ? Hypothyroidism   ? ? ?Social History: ?Social History  ? ?Socioeconomic History  ? Marital status: Married  ?  Spouse name: Not on file  ? Number of children: Not on file  ? Years of education: Not on file  ? Highest education level: Not on file  ?Occupational History  ? Not on file  ?Tobacco Use  ? Smoking status: Former  ?  Types: Cigarettes  ? Smokeless tobacco: Never  ?Substance and Sexual Activity  ? Alcohol use: Yes  ?  Comment: weekly  ? Drug use: Never  ? Sexual activity: Not on file  ?Other Topics Concern  ? Not on file  ?Social History Narrative  ? Not on file  ? ?Social Determinants of Health  ? ?Financial Resource Strain: Not on file  ?Food Insecurity: Not on file  ?Transportation Needs: Not on file  ?Physical Activity: Not on file  ?Stress: Not on file  ?Social Connections: Not on file  ? ? ?   ? View : No data to display.  ?  ?  ?  ? ? ?Labs: ? ?SCr: ?Lab Results  ?Component Value Date  ? CREATININE 0.88 10/23/2021  ? CREATININE 0.82 10/20/2021  ? CREATININE 0.91 10/19/2021  ? CREATININE  0.80 08/20/2021  ? CREATININE 0.81 02/17/2021  ? ?HIV ?Lab Results  ?Component Value Date  ? HIV NON-REACTIVE 08/20/2021  ? HIV NON-REACTIVE 05/20/2021  ? HIV NON-REACTIVE 02/17/2021  ? HIV NON-REACTIVE 11/14/2020  ? HIV NON-REACTIVE 08/05/2020  ? ?Hepatitis B ?No results found for: HEPBSAB, HEPBSAG, HEPBCAB ?Hepatitis C ?No results found for: HEPCAB, HCVRNAPCRQN ?Hepatitis A ?No results found for: HAV ?RPR and STI ?Lab Results  ?Component Value Date  ? LABRPR NON REACTIVE 10/23/2021  ? ? ?   ? View : No data to display.  ?  ?  ?  ? ? ?Assessment: ?Jocelyn Foster is here today to follow-up for PrEP. Currently taking Truvada, she reports having missed 1-2 doses while on vacation but otherwise is adherent and takes Truvada every day. She reports no tolerance issues. Her husband is HIV positive on Biktarvy. She still struggles with his diagnosis and sees a therapist. She wishes her husband would join her. Since she is low risk, will continue with follow-up visits every 6 months. All questions answered. Patient knows to reach out if she needs anything prior to her next appointment.  ? ?Plan: ?- HIV antibody + BMET today ?- Truvada x  6 months if HIV negative  ?- Follow-up visit scheduled for 10/3 ? ?Jerrilyn Cairo, PharmD ?PGY1 Pharmacy Resident ?02/09/2022 9:39 AM  ?

## 2022-02-10 ENCOUNTER — Other Ambulatory Visit: Payer: Self-pay | Admitting: Pharmacist

## 2022-02-10 ENCOUNTER — Other Ambulatory Visit (HOSPITAL_COMMUNITY): Payer: Self-pay

## 2022-02-10 DIAGNOSIS — Z7251 High risk heterosexual behavior: Secondary | ICD-10-CM

## 2022-02-10 LAB — BASIC METABOLIC PANEL
BUN: 14 mg/dL (ref 7–25)
CO2: 26 mmol/L (ref 20–32)
Calcium: 9 mg/dL (ref 8.6–10.4)
Chloride: 104 mmol/L (ref 98–110)
Creat: 0.82 mg/dL (ref 0.50–1.03)
Glucose, Bld: 103 mg/dL — ABNORMAL HIGH (ref 65–99)
Potassium: 4.4 mmol/L (ref 3.5–5.3)
Sodium: 138 mmol/L (ref 135–146)

## 2022-02-10 LAB — HIV ANTIBODY (ROUTINE TESTING W REFLEX): HIV 1&2 Ab, 4th Generation: NONREACTIVE

## 2022-02-10 MED ORDER — EMTRICITABINE-TENOFOVIR DF 200-300 MG PO TABS
1.0000 | ORAL_TABLET | Freq: Every day | ORAL | 5 refills | Status: DC
  Filled 2022-02-10 (×2): qty 30, 30d supply, fill #0
  Filled 2022-03-05: qty 30, 30d supply, fill #1
  Filled 2022-04-07: qty 30, 30d supply, fill #2
  Filled 2022-05-03: qty 30, 30d supply, fill #3
  Filled 2022-05-26: qty 30, 30d supply, fill #4
  Filled 2022-06-30: qty 30, 30d supply, fill #5

## 2022-02-11 ENCOUNTER — Other Ambulatory Visit (HOSPITAL_COMMUNITY): Payer: Self-pay

## 2022-03-05 ENCOUNTER — Other Ambulatory Visit (HOSPITAL_COMMUNITY): Payer: Self-pay

## 2022-03-10 ENCOUNTER — Other Ambulatory Visit (HOSPITAL_COMMUNITY): Payer: Self-pay

## 2022-04-07 ENCOUNTER — Other Ambulatory Visit (HOSPITAL_COMMUNITY): Payer: Self-pay

## 2022-05-03 ENCOUNTER — Other Ambulatory Visit (HOSPITAL_COMMUNITY): Payer: Self-pay

## 2022-05-06 ENCOUNTER — Other Ambulatory Visit (HOSPITAL_COMMUNITY): Payer: Self-pay

## 2022-05-26 ENCOUNTER — Other Ambulatory Visit (HOSPITAL_COMMUNITY): Payer: Self-pay

## 2022-06-02 ENCOUNTER — Other Ambulatory Visit (HOSPITAL_COMMUNITY): Payer: Self-pay

## 2022-06-24 ENCOUNTER — Other Ambulatory Visit (HOSPITAL_COMMUNITY): Payer: Self-pay

## 2022-06-30 ENCOUNTER — Other Ambulatory Visit (HOSPITAL_COMMUNITY): Payer: Self-pay

## 2022-07-15 ENCOUNTER — Other Ambulatory Visit (HOSPITAL_COMMUNITY): Payer: Self-pay

## 2022-07-23 ENCOUNTER — Other Ambulatory Visit (HOSPITAL_COMMUNITY): Payer: Self-pay

## 2022-07-27 ENCOUNTER — Other Ambulatory Visit (HOSPITAL_COMMUNITY): Payer: Self-pay

## 2022-08-03 ENCOUNTER — Ambulatory Visit (INDEPENDENT_AMBULATORY_CARE_PROVIDER_SITE_OTHER): Payer: Self-pay | Admitting: Pharmacist

## 2022-08-03 ENCOUNTER — Other Ambulatory Visit: Payer: Self-pay

## 2022-08-03 DIAGNOSIS — Z79899 Other long term (current) drug therapy: Secondary | ICD-10-CM

## 2022-08-03 NOTE — Patient Instructions (Signed)
[place] [facility] [place]  [facility] [phone]

## 2022-08-03 NOTE — Progress Notes (Signed)
Date:  08/03/2022   HPI: Jocelyn Foster is a 51 y.o. female who presents to the [facility] pharmacy clinic for HIV PrEP follow-up.  Insured   []    Uninsured  [x]    Patient Active Problem List   Diagnosis Date Noted   Cellulitis of arm 10/20/2021   Cellulitis of finger of left hand 10/20/2021   High risk sexual behavior 02/17/2021    Patient's Medications  New Prescriptions   No medications on file  Previous Medications   ARMOUR THYROID 30 MG TABLET    Take 30 mg by mouth every morning.   EMTRICITABINE-TENOFOVIR (TRUVADA) 200-300 MG TABLET    Take 1 tablet by mouth daily.   LIOTHYRONINE (CYTOMEL) 5 MCG TABLET    Take 5-10 mcg by mouth daily.  Modified Medications   No medications on file  Discontinued Medications   No medications on file    Allergies: Allergies  Allergen Reactions   Erythromycin Nausea And Vomiting    Past Medical History: Past Medical History:  Diagnosis Date   Hypothyroidism     Social History: Social History   Socioeconomic History   Marital status: Married    Spouse name: Not on file   Number of children: Not on file   Years of education: Not on file   Highest education level: Not on file  Occupational History   Not on file  Tobacco Use   Smoking status: Former    Types: Cigarettes   Smokeless tobacco: Never  Substance and Sexual Activity   Alcohol use: Yes    Comment: weekly   Drug use: Never   Sexual activity: Not on file  Other Topics Concern   Not on file  Social History Narrative   Not on file   Social Determinants of Health   Financial Resource Strain: Not on file  Food Insecurity: Not on file  Transportation Needs: Not on file  Physical Activity: Not on file  Stress: Not on file  Social Connections: Not on file        No data to display          Labs:  SCr: Lab Results  Component Value Date   CREATININE 0.82 02/09/2022   CREATININE 0.88 10/23/2021   CREATININE 0.82 10/20/2021   CREATININE 0.91  10/19/2021   CREATININE 0.80 08/20/2021   HIV Lab Results  Component Value Date   HIV NON-REACTIVE 02/09/2022   HIV NON-REACTIVE 08/20/2021   HIV NON-REACTIVE 05/20/2021   HIV NON-REACTIVE 02/17/2021   HIV NON-REACTIVE 11/14/2020   Hepatitis B No results found for: "HEPBSAB", "HEPBSAG", "HEPBCAB" Hepatitis C No results found for: "HEPCAB", "HCVRNAPCRQN" Hepatitis A No results found for: "HAV" RPR and STI Lab Results  Component Value Date   LABRPR NON REACTIVE 10/23/2021        No data to display          Assessment: Jocelyn Foster is here today for her 6 month PrEP follow up appointment. She continues to take Truvada every day without any side effects or issues. Her husband continues to take his HIV medication and she believes he is undetectable. She is tearful today as she is trying to get life insurance for her children and is unable to as they denied her because of the PrEP. We discussed the benefits and risks with stopping Truvada. Discussed U=U and how there is a 0% chance of him passing the virus to her as long as he is undetectable. She wishes to continue at this time  for peace of mind. We chatted about the injectable for PrEP, Apretude, and she may be interested in the future. She is asking about a primary care physician so I gave her a few referrals/ideas within [facility]. Last kidney check was in April and was normal. She will need q6 month screening since she is >84 years old now. Will update lab work today and see her back in 6 months or sooner if needed.  Plan: - HIV antibody and BMP today - Truvada x 6 months if HIV negative - F/u with me in April 2024  Jocelyn Foster L. Eber Hong, PharmD, BCIDP, AAHIVP, CPP Clinical Pharmacist Practitioner Infectious Diseases [place] [facility] 08/03/2022, 11:44 AM

## 2022-08-04 ENCOUNTER — Other Ambulatory Visit (HOSPITAL_COMMUNITY): Payer: Self-pay

## 2022-08-04 ENCOUNTER — Other Ambulatory Visit: Payer: Self-pay | Admitting: Pharmacist

## 2022-08-04 DIAGNOSIS — Z7251 High risk heterosexual behavior: Secondary | ICD-10-CM

## 2022-08-04 LAB — BASIC METABOLIC PANEL
BUN: 15 mg/dL (ref 7–25)
CO2: 26 mmol/L (ref 20–32)
Calcium: 9 mg/dL (ref 8.6–10.4)
Chloride: 102 mmol/L (ref 98–110)
Creat: 0.88 mg/dL (ref 0.50–1.03)
Glucose, Bld: 94 mg/dL (ref 65–99)
Potassium: 4.6 mmol/L (ref 3.5–5.3)
Sodium: 135 mmol/L (ref 135–146)

## 2022-08-04 LAB — HIV ANTIBODY (ROUTINE TESTING W REFLEX): HIV 1&2 Ab, 4th Generation: NONREACTIVE

## 2022-08-04 MED ORDER — EMTRICITABINE-TENOFOVIR DF 200-300 MG PO TABS
1.0000 | ORAL_TABLET | Freq: Every day | ORAL | 5 refills | Status: DC
  Filled 2022-08-04: qty 30, 30d supply, fill #0
  Filled 2022-08-25: qty 30, 30d supply, fill #1
  Filled 2022-09-22: qty 30, 30d supply, fill #2
  Filled 2022-10-19: qty 30, 30d supply, fill #3
  Filled 2022-11-15: qty 30, 30d supply, fill #4
  Filled 2022-12-15 – 2022-12-31 (×5): qty 30, 30d supply, fill #5

## 2022-08-24 ENCOUNTER — Other Ambulatory Visit: Payer: Self-pay | Admitting: Obstetrics and Gynecology

## 2022-08-24 DIAGNOSIS — Z1231 Encounter for screening mammogram for malignant neoplasm of breast: Secondary | ICD-10-CM

## 2022-08-25 ENCOUNTER — Other Ambulatory Visit (HOSPITAL_COMMUNITY): Payer: Self-pay

## 2022-08-30 ENCOUNTER — Other Ambulatory Visit (HOSPITAL_COMMUNITY): Payer: Self-pay

## 2022-09-21 ENCOUNTER — Other Ambulatory Visit (HOSPITAL_COMMUNITY): Payer: Self-pay

## 2022-09-22 ENCOUNTER — Other Ambulatory Visit (HOSPITAL_COMMUNITY): Payer: Self-pay

## 2022-09-30 ENCOUNTER — Other Ambulatory Visit (HOSPITAL_COMMUNITY): Payer: Self-pay

## 2022-10-05 IMAGING — MR MR [PERSON_NAME]*[PERSON_NAME]* WO/W CM
5 of 9 series · 19 of 40 positions shown · IV contrast (gadavist)
Comparison: None.

CLINICAL DATA: Left hand swelling and pain, possible osteomyelitis.
Laceration along the thenar region 7 days prior to imaging. Incision
and drainage 3 days ago. Fever and chills. Chronic HIV.

EXAM:
MRI OF THE LEFT HAND WITHOUT AND WITH CONTRAST
TECHNIQUE: Multiplanar, multisequence MR imaging of the left hand was performed
before and after the administration of intravenous contrast.
CONTRAST:  8mL GADAVIST GADOBUTROL 1 MMOL/ML IV SOLN

[Series 3: T1 · axial · 4.0mm · 0.27mm/px · z∈[-108,+87]mm · 5 of 46 slices shown (1 of 2)]
[im 1/46]
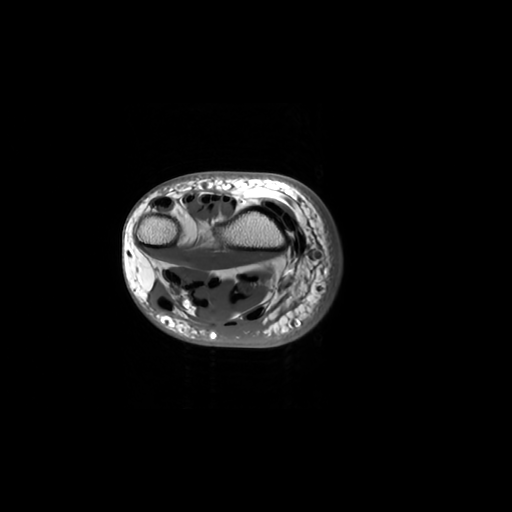
[im 12/46]
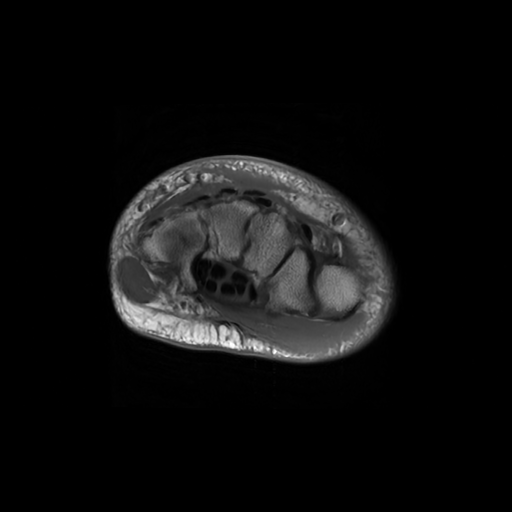
[im 23/46]
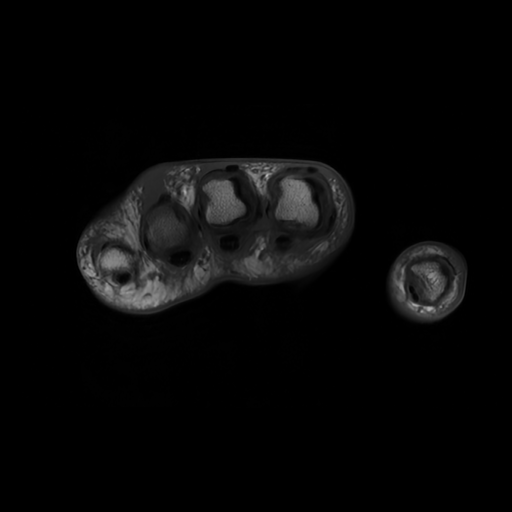
[im 34/46]
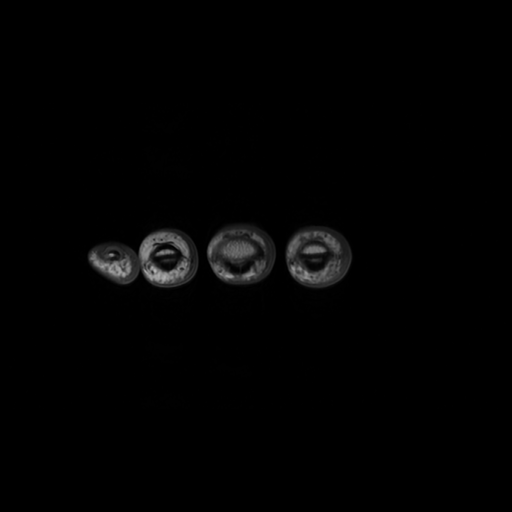
[im 46/46]
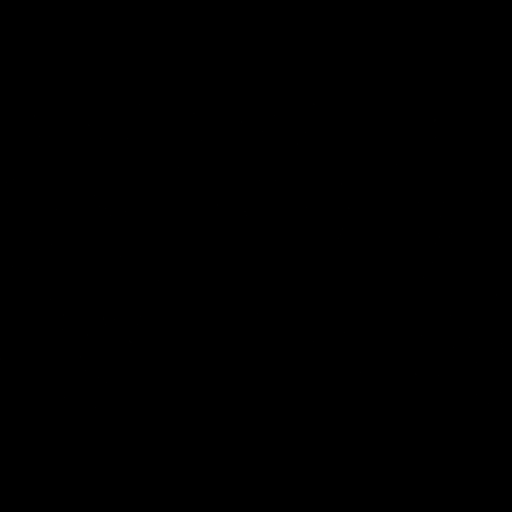

[Series 4: T2 fat-sat · axial · 4.0mm · 0.27mm/px · z∈[-108,+87]mm · 6 of 46 slices shown]
[im 1/46]
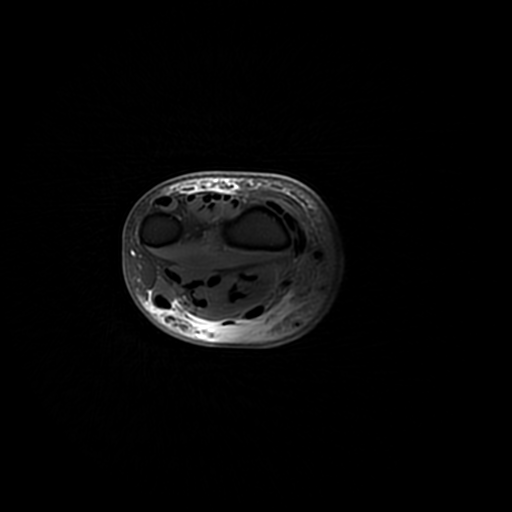
[im 10/46]
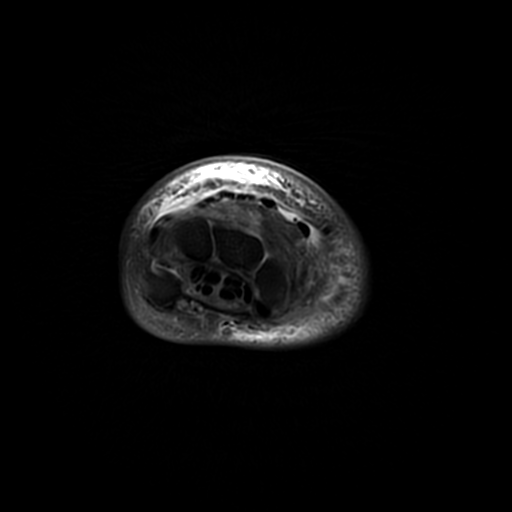
[im 19/46]
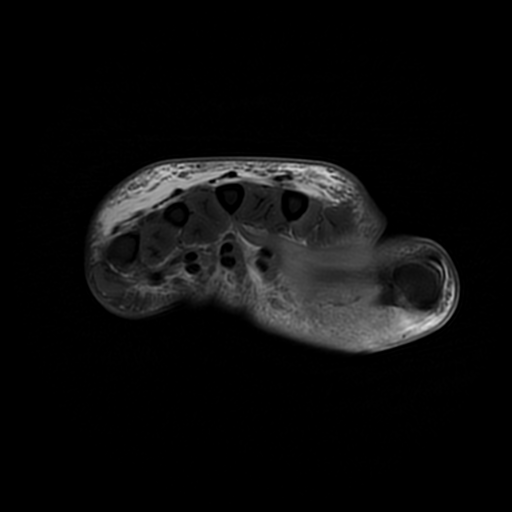
[im 28/46]
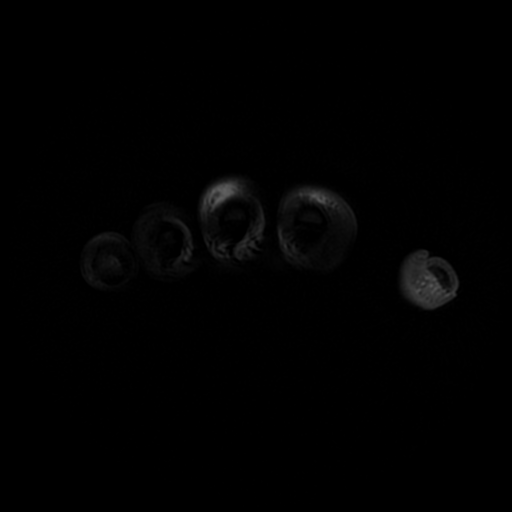
[im 37/46]
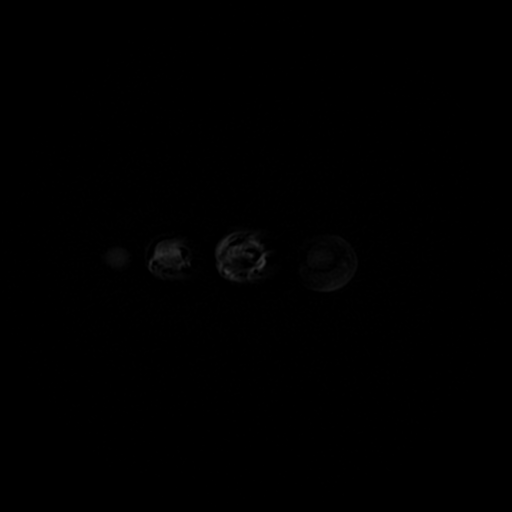
[im 46/46]
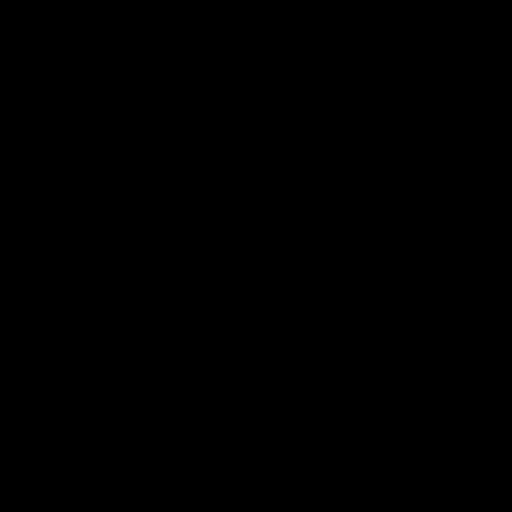

[Series 5: T1 · coronal · 3.0mm · 0.39mm/px · 3 of 20 slices shown (2 of 2)]
[im 1/20]
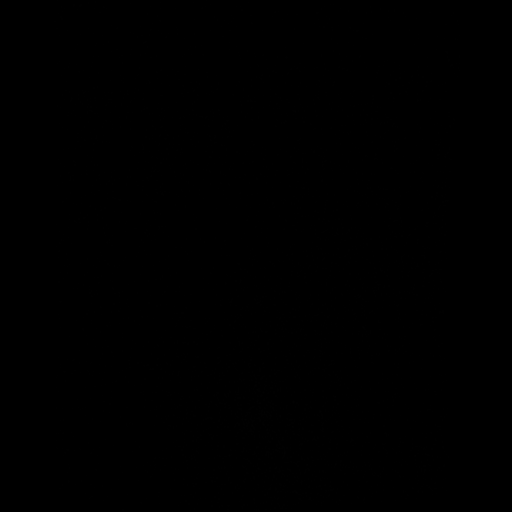
[im 10/20]
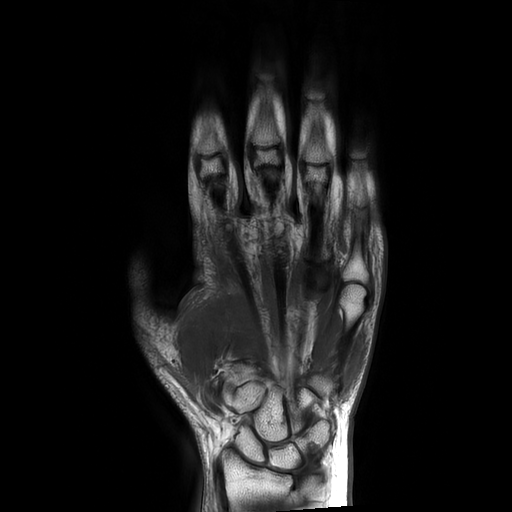
[im 20/20]
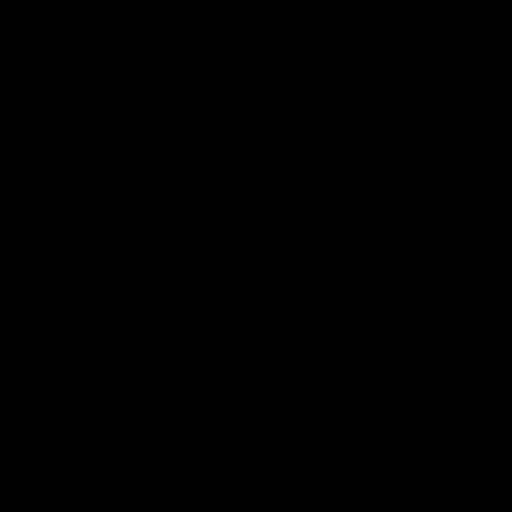

[Series 7: PD fat-sat · sagittal · 3.0mm · 0.35mm/px · 4 of 35 slices shown]
[im 1/35]
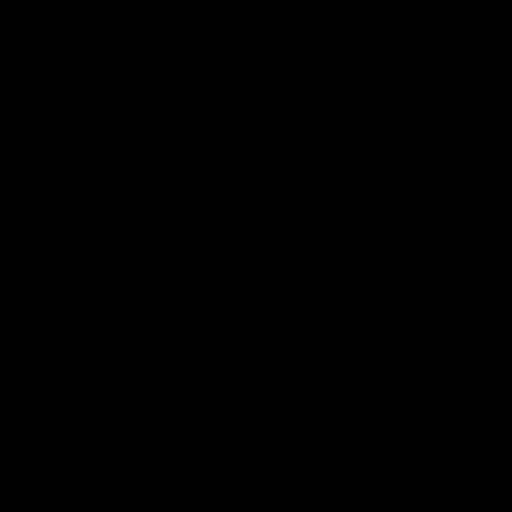
[im 12/35]
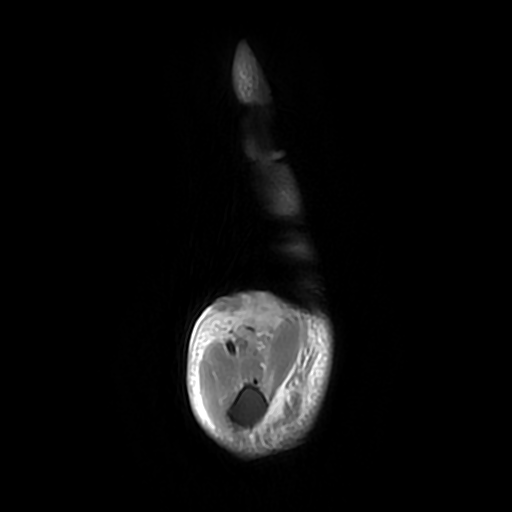
[im 23/35]
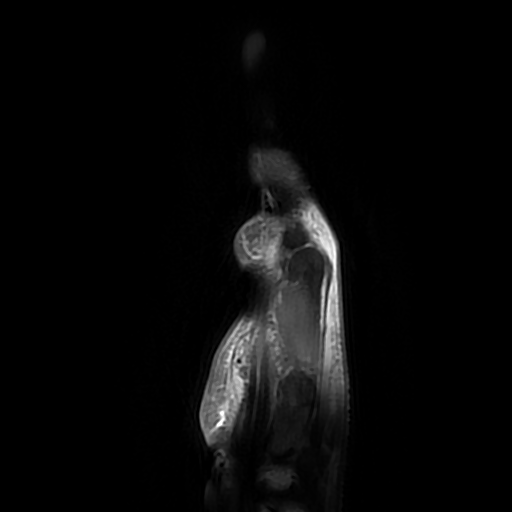
[im 35/35]
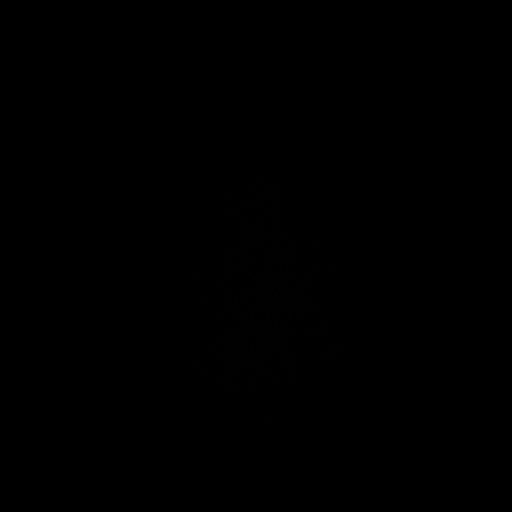

[Series 8: T1 fat-sat · axial · non-contrast · 4.0mm · 0.27mm/px · 1 of 46 slices shown]
[im 1/46]
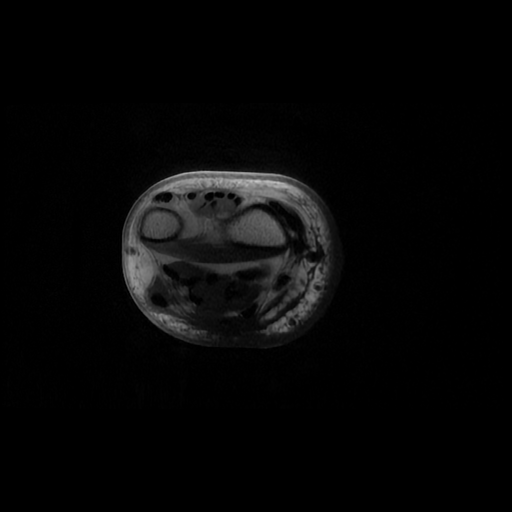

[19 of 40 positions shown; findings below may reference images not displayed]

FINDINGS: Bones/Joint/Cartilage

No findings of osteomyelitis. No substantial joint effusion to
indicate septic arthritis.

Ligaments

Today's exam was not focused on a specific joint and large field of
view images reduce sensitivity for ligamentous assessment. No
obvious ligamentous derangement is observed.

Muscles and Tendons

Mild edema signal and enhancement infiltrating the superficial head
of the flexor pollicis brevis and the adductor pollicis muscles for
example on image 13 series 6 and image 12 series 6. There may also
be some extension in deep pad of the flexor pollicis brevis. No
intramuscular abscess is identified.

Soft tissues

Subcutaneous edema in the hand notable along the thenar region and
also dorsally in the hand and tracking into the dorsal fingers, but
without drainable abscess. No visible gas in the soft tissues.
IMPRESSION: 1. Low-grade infiltrative edema signal in the flexor pollicis brevis
and adductor pollicis muscles compatible with low-grade myositis.
There is also infiltrative subcutaneous edema along the thenar
region and dorsum of the hand. No drainable abscess. No
osteomyelitis identified.

## 2022-10-07 ENCOUNTER — Ambulatory Visit
Admission: RE | Admit: 2022-10-07 | Discharge: 2022-10-07 | Disposition: A | Discharge status: Home / Self Care | Payer: PRIVATE HEALTH INSURANCE | Source: Ambulatory Visit | Attending: Obstetrics and Gynecology | Admitting: Obstetrics and Gynecology

## 2022-10-07 DIAGNOSIS — Z1231 Encounter for screening mammogram for malignant neoplasm of breast: Secondary | ICD-10-CM

## 2022-10-15 ENCOUNTER — Ambulatory Visit: Payer: PRIVATE HEALTH INSURANCE

## 2022-10-19 ENCOUNTER — Other Ambulatory Visit: Payer: Self-pay

## 2022-10-26 ENCOUNTER — Other Ambulatory Visit (HOSPITAL_COMMUNITY): Payer: Self-pay

## 2022-10-26 ENCOUNTER — Other Ambulatory Visit: Payer: Self-pay

## 2022-10-27 ENCOUNTER — Ambulatory Visit (AMBULATORY_SURGERY_CENTER): Payer: PRIVATE HEALTH INSURANCE | Admitting: *Deleted

## 2022-10-27 VITALS — Ht 66.0 in | Wt 168.0 lb

## 2022-10-27 DIAGNOSIS — Z1211 Encounter for screening for malignant neoplasm of colon: Secondary | ICD-10-CM

## 2022-10-27 MED ORDER — NA SULFATE-K SULFATE-MG SULF 17.5-3.13-1.6 GM/177ML PO SOLN: 1.0000 | Freq: Once | ORAL | 0 refills | Status: AC

## 2022-10-27 NOTE — Progress Notes (Signed)
No egg or soy allergy known to patient  No issues known to pt with past sedation with any surgeries or procedures Patient denies ever being told they had issues or difficulty with intubation  No FH of Malignant Hyperthermia Pt is not on diet pills Pt is not on  home 02  Pt is not on blood thinners  Pt denies issues with constipation  No A fib or A flutter Have any cardiac testing pending--NO Pt instructed to use Singlecare.com or GoodRx for a price reduction on prep    Patient's chart reviewed by Jocelyn Foster CNRA prior to previsit and patient appropriate for the [facility].  Previsit completed and red dot placed by patient's name on their procedure day (on provider's schedule).    

## 2022-11-08 ENCOUNTER — Encounter: Payer: PRIVATE HEALTH INSURANCE | Admitting: Gastroenterology

## 2022-11-15 ENCOUNTER — Other Ambulatory Visit (HOSPITAL_COMMUNITY): Payer: Self-pay

## 2022-11-19 ENCOUNTER — Other Ambulatory Visit (HOSPITAL_COMMUNITY): Payer: Self-pay

## 2022-12-15 ENCOUNTER — Other Ambulatory Visit (HOSPITAL_COMMUNITY): Payer: Self-pay

## 2022-12-17 ENCOUNTER — Other Ambulatory Visit: Payer: Self-pay

## 2022-12-20 ENCOUNTER — Other Ambulatory Visit (HOSPITAL_COMMUNITY): Payer: Self-pay

## 2022-12-21 ENCOUNTER — Telehealth: Payer: Self-pay

## 2022-12-21 NOTE — Telephone Encounter (Signed)
[facility] Patient Advocate Encounter  Completed and sent [employer] Adv[employer] Access application for Truvada for this patient who is uninsured.    Patient assistance phone number for follow up is [phone].   This encounter will be updated until final determination.,   Ileene Patrick, [place] Patient [facility] Phone: [phone] Fax:  [phone]

## 2022-12-22 ENCOUNTER — Encounter: Payer: PRIVATE HEALTH INSURANCE | Admitting: Gastroenterology

## 2022-12-23 ENCOUNTER — Telehealth: Payer: Self-pay

## 2022-12-23 ENCOUNTER — Other Ambulatory Visit (HOSPITAL_COMMUNITY): Payer: Self-pay

## 2022-12-23 NOTE — Telephone Encounter (Signed)
[facility] Patient Advocate Encounter  Completed and sent [employer] Advancing Access application for Truvada for this patient who is uninsured.    Patient is approved 12/22/22 through 12/22/23.  BIN      [id number] PCN    [id number] GRP    [id number] ID        [id number]   Ileene Patrick, CPhT Specialty Pharmacy Patient [facility] for [facility] Disease Phone: [phone] Fax:  [phone]

## 2022-12-24 ENCOUNTER — Other Ambulatory Visit (HOSPITAL_COMMUNITY): Payer: Self-pay

## 2022-12-27 ENCOUNTER — Other Ambulatory Visit (HOSPITAL_COMMUNITY): Payer: Self-pay

## 2022-12-27 ENCOUNTER — Ambulatory Visit (AMBULATORY_SURGERY_CENTER): Payer: PRIVATE HEALTH INSURANCE

## 2022-12-27 VITALS — Ht 66.0 in | Wt 170.0 lb

## 2022-12-27 DIAGNOSIS — Z1211 Encounter for screening for malignant neoplasm of colon: Secondary | ICD-10-CM

## 2022-12-27 MED ORDER — NA SULFATE-K SULFATE-MG SULF 17.5-3.13-1.6 GM/177ML PO SOLN: 1.0000 | Freq: Once | ORAL | 0 refills | Status: AC

## 2022-12-27 NOTE — Progress Notes (Signed)
No egg or soy allergy known to patient  No issues known to pt with past sedation with any surgeries or procedures Patient denies ever being told they had issues or difficulty with intubation  No FH of Malignant Hyperthermia Pt is not on diet pills Pt is not on  home 02  Pt is not on blood thinners  Pt denies issues with constipation  No A fib or A flutter Have any cardiac testing pending-- NO Pt instructed to use Singlecare.com or GoodRx for a price reduction on prep   Patient's chart reviewed by Osvaldo Angst CNRA prior to previsit and patient appropriate for the [place].  Previsit completed and red dot placed by patient's name on their procedure day (on provider's schedule).

## 2022-12-30 ENCOUNTER — Other Ambulatory Visit (HOSPITAL_COMMUNITY): Payer: Self-pay

## 2022-12-31 ENCOUNTER — Other Ambulatory Visit (HOSPITAL_COMMUNITY): Payer: Self-pay

## 2023-01-03 ENCOUNTER — Other Ambulatory Visit: Payer: Self-pay

## 2023-01-03 ENCOUNTER — Other Ambulatory Visit (HOSPITAL_COMMUNITY): Payer: Self-pay

## 2023-01-14 ENCOUNTER — Other Ambulatory Visit (HOSPITAL_COMMUNITY): Payer: Self-pay

## 2023-01-25 ENCOUNTER — Other Ambulatory Visit (HOSPITAL_COMMUNITY): Payer: Self-pay

## 2023-01-25 ENCOUNTER — Other Ambulatory Visit: Payer: Self-pay | Admitting: Pharmacist

## 2023-01-25 DIAGNOSIS — Z7251 High risk heterosexual behavior: Secondary | ICD-10-CM

## 2023-01-26 ENCOUNTER — Other Ambulatory Visit (HOSPITAL_COMMUNITY): Payer: Self-pay

## 2023-01-26 ENCOUNTER — Encounter: Payer: PRIVATE HEALTH INSURANCE | Admitting: Gastroenterology

## 2023-02-03 ENCOUNTER — Ambulatory Visit: Payer: PRIVATE HEALTH INSURANCE | Admitting: Pharmacist

## 2023-02-03 ENCOUNTER — Other Ambulatory Visit (HOSPITAL_COMMUNITY): Payer: Self-pay

## 2023-02-03 ENCOUNTER — Ambulatory Visit (INDEPENDENT_AMBULATORY_CARE_PROVIDER_SITE_OTHER): Payer: Self-pay | Admitting: Pharmacist

## 2023-02-03 ENCOUNTER — Other Ambulatory Visit: Payer: Self-pay

## 2023-02-03 DIAGNOSIS — Z79899 Other long term (current) drug therapy: Secondary | ICD-10-CM

## 2023-02-03 DIAGNOSIS — Z2981 Encounter for HIV pre-exposure prophylaxis: Secondary | ICD-10-CM

## 2023-02-03 DIAGNOSIS — Z7251 High risk heterosexual behavior: Secondary | ICD-10-CM

## 2023-02-03 MED ORDER — EMTRICITABINE-TENOFOVIR DF 200-300 MG PO TABS
1.0000 | ORAL_TABLET | Freq: Every day | ORAL | 5 refills | Status: DC
  Filled 2023-02-03: qty 30, 30d supply, fill #0
  Filled 2023-02-24: qty 30, 30d supply, fill #1
  Filled 2023-03-23: qty 30, 30d supply, fill #2
  Filled 2023-04-21: qty 30, 30d supply, fill #3
  Filled 2023-05-20 (×2): qty 30, 30d supply, fill #4
  Filled 2023-06-17: qty 30, 30d supply, fill #5

## 2023-02-04 LAB — BASIC METABOLIC PANEL
BUN: 24 mg/dL (ref 7–25)
CO2: 24 mmol/L (ref 20–32)
Calcium: 9.5 mg/dL (ref 8.6–10.4)
Chloride: 105 mmol/L (ref 98–110)
Creat: 0.94 mg/dL (ref 0.50–1.03)
Glucose, Bld: 79 mg/dL (ref 65–99)
Potassium: 4.3 mmol/L (ref 3.5–5.3)
Sodium: 139 mmol/L (ref 135–146)

## 2023-02-04 LAB — HIV ANTIBODY (ROUTINE TESTING W REFLEX): HIV 1&2 Ab, 4th Generation: NONREACTIVE

## 2023-02-04 NOTE — Progress Notes (Signed)
Date:  02/04/2023   HPI: Jocelyn Foster is a 52 y.o. female who presents to the [facility] pharmacy clinic for HIV PrEP follow-up.  Insured   []    Uninsured  [x]    Patient Active Problem List   Diagnosis Date Noted   Cellulitis of arm 10/20/2021   Cellulitis of finger of left hand 10/20/2021   High risk sexual behavior 02/17/2021    Patient's Medications  New Prescriptions   No medications on file  Previous Medications   ARMOUR THYROID 30 MG TABLET    Take 30 mg by mouth every morning.   LIOTHYRONINE (CYTOMEL) 5 MCG TABLET    Take 5-10 mcg by mouth daily.  Modified Medications   Modified Medication Previous Medication   EMTRICITABINE-TENOFOVIR (TRUVADA) 200-300 MG TABLET emtricitabine-tenofovir (TRUVADA) 200-300 MG tablet      Take 1 tablet by mouth daily.    Take 1 tablet by mouth daily.  Discontinued Medications   No medications on file    Allergies: Allergies  Allergen Reactions   Erythromycin Nausea And Vomiting    Past Medical History: Past Medical History:  Diagnosis Date   Allergy    SEASONAL   Hypothyroidism     Social History: Social History   Socioeconomic History   Marital status: Married    Spouse name: Not on file   Number of children: Not on file   Years of education: Not on file   Highest education level: Not on file  Occupational History   Not on file  Tobacco Use   Smoking status: Former    Types: Cigarettes    Quit date: 2000    Years since quitting: 24.2    Passive exposure: Never   Smokeless tobacco: Never  Vaping Use   Vaping Use: Never used  Substance and Sexual Activity   Alcohol use: Yes    Comment: OCCASSIONALLY   Drug use: Never   Sexual activity: Not on file  Other Topics Concern   Not on file  Social History Narrative   Not on file   Social Determinants of Health   Financial Resource Strain: Not on file  Food Insecurity: Not on file  Transportation Needs: Not on file  Physical Activity: Not on file  Stress:  Not on file  Social Connections: Not on file        No data to display          Labs:  SCr: Lab Results  Component Value Date   CREATININE 0.94 02/03/2023   CREATININE 0.88 08/03/2022   CREATININE 0.82 02/09/2022   CREATININE 0.88 10/23/2021   CREATININE 0.82 10/20/2021   HIV Lab Results  Component Value Date   HIV NON-REACTIVE 02/03/2023   HIV NON-REACTIVE 08/03/2022   HIV NON-REACTIVE 02/09/2022   HIV NON-REACTIVE 08/20/2021   HIV NON-REACTIVE 05/20/2021   Hepatitis B No results found for: "HEPBSAB", "HEPBSAG", "HEPBCAB" Hepatitis C No results found for: "HEPCAB", "HCVRNAPCRQN" Hepatitis A No results found for: "HAV" RPR and STI Lab Results  Component Value Date   LABRPR NON REACTIVE 10/23/2021        No data to display          Assessment: Jocelyn Foster is here today for her 6 month PrEP follow up. She continues to take Truvada every day with only occasional missed doses. She is approved for patient assistance through February 2025. Last BMP was in October and her SCr was normal. She continues to struggle with taking PrEP and states that she  wishes there were another medication to take. I explained Apretude but she is not interested. Discussed U=U once again and she is too afraid to stop taking Truvada. She states that her HIV positive husband takes his medication every day. No other issues today. Will check labs and see her back in 6 months.   Plan: - HIV antibody and BMP today - Truvada x 6 months if HIV negative - F/u with me again in October  Emiko Osorto L. Ransom Nickson, PharmD, BCIDP, AAHIVP, CPP Clinical Pharmacist Practitioner Infectious Diseases Clinical Pharmacist [facility] 02/04/2023, 4:26 PM

## 2023-02-21 ENCOUNTER — Encounter: Payer: PRIVATE HEALTH INSURANCE | Admitting: Gastroenterology

## 2023-02-24 ENCOUNTER — Other Ambulatory Visit (HOSPITAL_COMMUNITY): Payer: Self-pay

## 2023-03-01 ENCOUNTER — Other Ambulatory Visit (HOSPITAL_COMMUNITY): Payer: Self-pay

## 2023-03-23 ENCOUNTER — Other Ambulatory Visit (HOSPITAL_COMMUNITY): Payer: Self-pay

## 2023-03-25 ENCOUNTER — Other Ambulatory Visit (HOSPITAL_COMMUNITY): Payer: Self-pay

## 2023-04-19 ENCOUNTER — Other Ambulatory Visit (HOSPITAL_COMMUNITY): Payer: Self-pay

## 2023-04-21 ENCOUNTER — Other Ambulatory Visit (HOSPITAL_COMMUNITY): Payer: Self-pay

## 2023-04-22 ENCOUNTER — Other Ambulatory Visit (HOSPITAL_COMMUNITY): Payer: Self-pay

## 2023-05-13 ENCOUNTER — Other Ambulatory Visit (HOSPITAL_COMMUNITY): Payer: Self-pay

## 2023-05-17 ENCOUNTER — Other Ambulatory Visit (HOSPITAL_COMMUNITY): Payer: Self-pay

## 2023-05-20 ENCOUNTER — Other Ambulatory Visit (HOSPITAL_COMMUNITY): Payer: Self-pay

## 2023-06-15 ENCOUNTER — Other Ambulatory Visit (HOSPITAL_COMMUNITY): Payer: Self-pay

## 2023-06-17 ENCOUNTER — Other Ambulatory Visit (HOSPITAL_COMMUNITY): Payer: Self-pay

## 2023-07-07 ENCOUNTER — Other Ambulatory Visit (HOSPITAL_COMMUNITY): Payer: Self-pay

## 2023-07-11 ENCOUNTER — Other Ambulatory Visit: Payer: Self-pay

## 2023-07-11 ENCOUNTER — Other Ambulatory Visit: Payer: Self-pay | Admitting: Pharmacist

## 2023-07-11 ENCOUNTER — Other Ambulatory Visit (HOSPITAL_COMMUNITY): Payer: Self-pay

## 2023-07-11 DIAGNOSIS — Z7251 High risk heterosexual behavior: Secondary | ICD-10-CM

## 2023-07-11 MED ORDER — EMTRICITABINE-TENOFOVIR DF 200-300 MG PO TABS
1.0000 | ORAL_TABLET | Freq: Every day | ORAL | 0 refills | Status: DC
  Filled 2023-07-11: qty 30, 30d supply, fill #0

## 2023-07-19 ENCOUNTER — Other Ambulatory Visit (HOSPITAL_COMMUNITY): Payer: Self-pay

## 2023-08-03 ENCOUNTER — Other Ambulatory Visit: Payer: Self-pay

## 2023-08-03 NOTE — Progress Notes (Signed)
   HPI: Jocelyn Foster is a 52 y.o. female who presents to the [facility] pharmacy clinic for HIV PrEP follow-up.  Insured   []    Uninsured  [x]    Patient Active Problem List   Diagnosis Date Noted   Cellulitis of arm 10/20/2021   Cellulitis of finger of left hand 10/20/2021   High risk sexual behavior 02/17/2021    Patient's Medications  New Prescriptions   No medications on file  Previous Medications   ARMOUR THYROID 30 MG TABLET    Take 30 mg by mouth every morning.   EMTRICITABINE-TENOFOVIR (TRUVADA) 200-300 MG TABLET    Take 1 tablet by mouth daily.   LIOTHYRONINE (CYTOMEL) 5 MCG TABLET    Take 5-10 mcg by mouth daily.  Modified Medications   No medications on file  Discontinued Medications   No medications on file        No data to display          Labs:  SCr: Lab Results  Component Value Date   CREATININE 0.94 02/03/2023   CREATININE 0.88 08/03/2022   CREATININE 0.82 02/09/2022   CREATININE 0.88 10/23/2021   CREATININE 0.82 10/20/2021   HIV Lab Results  Component Value Date   HIV NON-REACTIVE 02/03/2023   HIV NON-REACTIVE 08/03/2022   HIV NON-REACTIVE 02/09/2022   HIV NON-REACTIVE 08/20/2021   HIV NON-REACTIVE 05/20/2021   Hepatitis B No results found for: "HEPBSAB", "HEPBSAG", "HEPBCAB" Hepatitis C No results found for: "HEPCAB", "HCVRNAPCRQN" Hepatitis A No results found for: "HAV" RPR and STI Lab Results  Component Value Date   LABRPR NON REACTIVE 10/23/2021        No data to display          Assessment: Jocelyn Foster comes in today for HIV PrEP follow up. She continues to take Truvada every day with no missed doses. Her husband remains on treatment for his HIV infection and sees [place]. No problems. Will check labs and see her back in 6 months.   Plan: - HIV antibody and BMP today - Truvada x 6 months if HIV negative - Follow up with me again on 02/06/24  Jocelyn Foster L. Jocelyn Foster, PharmD, BCIDP, AAHIVP, CPP Clinical Pharmacist  Practitioner Infectious Diseases Clinical Pharmacist [facility] 08/03/2023, 4:41 PM

## 2023-08-08 ENCOUNTER — Ambulatory Visit (INDEPENDENT_AMBULATORY_CARE_PROVIDER_SITE_OTHER): Payer: Self-pay | Admitting: Pharmacist

## 2023-08-08 ENCOUNTER — Other Ambulatory Visit: Payer: Self-pay

## 2023-08-08 DIAGNOSIS — Z79899 Other long term (current) drug therapy: Secondary | ICD-10-CM

## 2023-08-08 DIAGNOSIS — Z2981 Encounter for HIV pre-exposure prophylaxis: Secondary | ICD-10-CM

## 2023-08-09 ENCOUNTER — Other Ambulatory Visit: Payer: Self-pay

## 2023-08-09 ENCOUNTER — Other Ambulatory Visit: Payer: Self-pay | Admitting: Pharmacist

## 2023-08-09 DIAGNOSIS — Z7251 High risk heterosexual behavior: Secondary | ICD-10-CM

## 2023-08-09 LAB — BASIC METABOLIC PANEL
BUN: 19 mg/dL (ref 7–25)
CO2: 25 mmol/L (ref 20–32)
Calcium: 9.2 mg/dL (ref 8.6–10.4)
Chloride: 102 mmol/L (ref 98–110)
Creat: 0.87 mg/dL (ref 0.50–1.03)
Glucose, Bld: 89 mg/dL (ref 65–99)
Potassium: 4.7 mmol/L (ref 3.5–5.3)
Sodium: 136 mmol/L (ref 135–146)

## 2023-08-09 LAB — HIV ANTIBODY (ROUTINE TESTING W REFLEX): HIV 1&2 Ab, 4th Generation: NONREACTIVE

## 2023-08-09 MED ORDER — EMTRICITABINE-TENOFOVIR DF 200-300 MG PO TABS
1.0000 | ORAL_TABLET | Freq: Every day | ORAL | 5 refills | Status: DC
  Filled 2023-08-09 – 2023-08-11 (×2): qty 30, 30d supply, fill #0
  Filled 2023-09-05: qty 30, 30d supply, fill #1
  Filled 2023-10-10: qty 30, 30d supply, fill #2
  Filled 2023-11-08: qty 30, 30d supply, fill #3
  Filled 2023-12-02: qty 30, 30d supply, fill #4
  Filled 2023-12-27: qty 30, 30d supply, fill #5

## 2023-08-09 NOTE — Progress Notes (Signed)
Specialty Pharmacy Ongoing Clinical Assessment Note  Shameria Trimarco is a 52 y.o. female who is being followed by the specialty pharmacy service for RxSp HIV PrEP   Patient's specialty medication(s) reviewed today: Emtricitabine-Tenofovir Df   Missed doses in the last 4 weeks: 0   Patient did not have any additional questions or concerns.   Therapeutic benefit summary: Patient is achieving benefit   Adverse events/side effects summary: No adverse events/side effects   Patient's therapy is appropriate to: Continue    Goals Addressed             This Visit's Progress    Comply with lab assessments       Patient is on track. Patient will adhere to provider and/or lab appointments.      Improve or maintain quality of life       Patient is on track. Patient will be monitored by provider to determine if a change in treatment plan is warranted.      Maintain optimal adherence to therapy       Patient is on track. Patient will maintain adherence.         Follow up:  6 months  Adithi Gammon L. Nunzio Banet, PharmD, BCIDP, AAHIVP, CPP Clinical Pharmacist Practitioner Infectious Diseases Clinical Pharmacist [facility] 08/09/2023, 1:59 PM

## 2023-08-11 ENCOUNTER — Other Ambulatory Visit (HOSPITAL_COMMUNITY): Payer: Self-pay

## 2023-08-11 ENCOUNTER — Other Ambulatory Visit: Payer: Self-pay

## 2023-08-11 ENCOUNTER — Other Ambulatory Visit (HOSPITAL_COMMUNITY): Payer: Self-pay | Admitting: Pharmacy Technician

## 2023-08-11 NOTE — Progress Notes (Signed)
Specialty Pharmacy Refill Coordination Note  Jocelyn Foster is a 52 y.o. female contacted today regarding refills of specialty medication(s) Emtricitabine-Tenofovir Df   Patient requested Delivery   Delivery date: 08/17/23   Verified address: [address] [place]   Medication will be filled on 08/16/23.

## 2023-08-16 ENCOUNTER — Other Ambulatory Visit: Payer: Self-pay

## 2023-08-31 ENCOUNTER — Other Ambulatory Visit: Payer: Self-pay

## 2023-09-02 ENCOUNTER — Other Ambulatory Visit: Payer: Self-pay

## 2023-09-05 ENCOUNTER — Other Ambulatory Visit (HOSPITAL_COMMUNITY): Payer: Self-pay

## 2023-09-05 ENCOUNTER — Other Ambulatory Visit (HOSPITAL_COMMUNITY): Payer: Self-pay | Admitting: Pharmacy Technician

## 2023-09-05 NOTE — Progress Notes (Signed)
Specialty Pharmacy Refill Coordination Note  Jocelyn Foster is a 52 y.o. female contacted today regarding refills of specialty medication(s) Emtricitabine-Tenofovir Df   Patient requested Delivery   Delivery date: 09/13/23   Verified address: [address] [place]   Medication will be filled on 09/12/23.

## 2023-09-08 ENCOUNTER — Other Ambulatory Visit: Payer: Self-pay | Admitting: Obstetrics and Gynecology

## 2023-09-08 DIAGNOSIS — Z1231 Encounter for screening mammogram for malignant neoplasm of breast: Secondary | ICD-10-CM

## 2023-09-12 ENCOUNTER — Other Ambulatory Visit: Payer: Self-pay

## 2023-10-04 ENCOUNTER — Other Ambulatory Visit: Payer: Self-pay

## 2023-10-07 ENCOUNTER — Other Ambulatory Visit: Payer: Self-pay

## 2023-10-10 ENCOUNTER — Ambulatory Visit
Admission: RE | Admit: 2023-10-10 | Discharge: 2023-10-10 | Disposition: A | Discharge status: Home / Self Care | Payer: PRIVATE HEALTH INSURANCE | Source: Ambulatory Visit | Attending: Obstetrics and Gynecology | Admitting: Obstetrics and Gynecology

## 2023-10-10 ENCOUNTER — Other Ambulatory Visit (HOSPITAL_COMMUNITY): Payer: Self-pay | Admitting: Pharmacy Technician

## 2023-10-10 ENCOUNTER — Other Ambulatory Visit (HOSPITAL_COMMUNITY): Payer: Self-pay

## 2023-10-10 DIAGNOSIS — Z1231 Encounter for screening mammogram for malignant neoplasm of breast: Secondary | ICD-10-CM

## 2023-10-10 NOTE — Progress Notes (Signed)
Specialty Pharmacy Refill Coordination Note  Jocelyn Foster is a 52 y.o. female contacted today regarding refills of specialty medication(s) Emtricitabine-Tenofovir Df   Patient requested Delivery   Delivery date: 10/13/23   Verified address: Patient address [address]  [place]   Medication will be filled on 10/12/23.

## 2023-10-12 ENCOUNTER — Other Ambulatory Visit: Payer: Self-pay

## 2023-11-01 ENCOUNTER — Other Ambulatory Visit: Payer: Self-pay

## 2023-11-08 ENCOUNTER — Other Ambulatory Visit: Payer: Self-pay

## 2023-11-08 NOTE — Progress Notes (Signed)
 Specialty Pharmacy Refill Coordination Note  Jocelyn Foster is a 53 y.o. female contacted today regarding refills of specialty medication(s) No data recorded  Patient requested (Patient-Rptd) Delivery   Delivery date: (Patient-Rptd) 11/10/23   Verified address: (Patient-Rptd) [address] [place] [address]   Medication will be filled on 11/09/23.

## 2023-11-09 ENCOUNTER — Other Ambulatory Visit: Payer: Self-pay

## 2023-11-28 ENCOUNTER — Other Ambulatory Visit (HOSPITAL_COMMUNITY): Payer: Self-pay

## 2023-12-02 ENCOUNTER — Other Ambulatory Visit (HOSPITAL_COMMUNITY): Payer: Self-pay

## 2023-12-02 ENCOUNTER — Other Ambulatory Visit: Payer: Self-pay | Admitting: Pharmacy Technician

## 2023-12-02 NOTE — Progress Notes (Signed)
Specialty Pharmacy Refill Coordination Note  Jocelyn Foster is a 53 y.o. female contacted today regarding refills of specialty medication(s)   Emtricitabine-Tenofovir Df    Patient requested (Patient-Rptd) Delivery   Delivery date: (Patient-Rptd) May 22, 2071 Ship 2/4 for 2/5   Verified address: (Patient-Rptd) [address] [place] [address]   Medication will be filled on 12/06/23.

## 2023-12-05 ENCOUNTER — Other Ambulatory Visit (HOSPITAL_COMMUNITY): Payer: Self-pay

## 2023-12-06 ENCOUNTER — Other Ambulatory Visit: Payer: Self-pay

## 2023-12-06 ENCOUNTER — Other Ambulatory Visit (HOSPITAL_COMMUNITY): Payer: Self-pay

## 2023-12-27 ENCOUNTER — Other Ambulatory Visit (HOSPITAL_COMMUNITY): Payer: Self-pay

## 2023-12-27 ENCOUNTER — Other Ambulatory Visit: Payer: Self-pay

## 2023-12-27 NOTE — Progress Notes (Signed)
 Specialty Pharmacy Refill Coordination Note  Jocelyn Foster is a 53 y.o. female contacted today regarding refills of specialty medication(s) Emtricitabine-Tenofovir DF (TRUVADA)   Patient requested Delivery   Delivery date: 01/03/24   Verified address: [address]   [place] [address]   Medication will be filled on 01/02/24.

## 2024-01-19 ENCOUNTER — Other Ambulatory Visit: Payer: Self-pay

## 2024-02-03 NOTE — Progress Notes (Signed)
   HPI: Ingris Pasquarella is a 53 y.o. female who presents to the [facility] pharmacy clinic for HIV PrEP follow-up.  Insured   [x]    Uninsured  []    Patient Active Problem List   Diagnosis Date Noted   Cellulitis of arm 10/20/2021   Cellulitis of finger of left hand 10/20/2021   High risk sexual behavior 02/17/2021    Patient's Medications  New Prescriptions   No medications on file  Previous Medications   ARMOUR THYROID 30 MG TABLET    Take 30 mg by mouth every morning.   EMTRICITABINE-TENOFOVIR (TRUVADA) 200-300 MG TABLET    Take 1 tablet by mouth daily.   LIOTHYRONINE (CYTOMEL) 5 MCG TABLET    Take 5-10 mcg by mouth daily.  Modified Medications   No medications on file  Discontinued Medications   No medications on file        No data to display          Labs:  SCr: Lab Results  Component Value Date   CREATININE 0.87 08/08/2023   CREATININE 0.94 02/03/2023   CREATININE 0.88 08/03/2022   CREATININE 0.82 02/09/2022   CREATININE 0.88 10/23/2021   HIV Lab Results  Component Value Date   HIV NON-REACTIVE 08/08/2023   HIV NON-REACTIVE 02/03/2023   HIV NON-REACTIVE 08/03/2022   HIV NON-REACTIVE 02/09/2022   HIV NON-REACTIVE 08/20/2021   Hepatitis B No results found for: "HEPBSAB", "HEPBSAG", "HEPBCAB" Hepatitis C No results found for: "HEPCAB", "HCVRNAPCRQN" Hepatitis A No results found for: "HAV" RPR and STI Lab Results  Component Value Date   LABRPR NON REACTIVE 10/23/2021        No data to display          Assessment: Neenah is here today to follow up for HIV PrEP. She is seen every 6 months as she is married to a HIV positive female who is undetectable on ART.  No problems with tolerability or filling at [facility]. No missed doses. Will check labs and see her back in 6 months.  She did start taking sea moss and oil of oregano. No drug interactions found with Truvada.   Plan: - HIV antibody + BMP today - Truvada x 3 months  if HIV negative - Follow up with me again on 08/06/24  [place] Woon L. Adia Crammer, PharmD, BCIDP, AAHIVP, CPP Clinical Pharmacist Practitioner - Infectious Diseases Clinical Pharmacist Lead - Specialty Pharmacy [facility] 02/03/2024, 3:17 PM

## 2024-02-06 ENCOUNTER — Other Ambulatory Visit: Payer: Self-pay

## 2024-02-06 ENCOUNTER — Other Ambulatory Visit (HOSPITAL_COMMUNITY): Payer: Self-pay

## 2024-02-06 ENCOUNTER — Ambulatory Visit (INDEPENDENT_AMBULATORY_CARE_PROVIDER_SITE_OTHER): Payer: PRIVATE HEALTH INSURANCE | Admitting: Pharmacist

## 2024-02-06 DIAGNOSIS — Z79899 Other long term (current) drug therapy: Secondary | ICD-10-CM

## 2024-02-07 LAB — HIV ANTIBODY (ROUTINE TESTING W REFLEX): HIV 1&2 Ab, 4th Generation: NONREACTIVE

## 2024-02-07 LAB — BASIC METABOLIC PANEL WITH GFR
BUN: 18 mg/dL (ref 7–25)
CO2: 26 mmol/L (ref 20–32)
Calcium: 9.7 mg/dL (ref 8.6–10.4)
Chloride: 103 mmol/L (ref 98–110)
Creat: 0.85 mg/dL (ref 0.50–1.03)
Glucose, Bld: 73 mg/dL (ref 65–99)
Potassium: 4.7 mmol/L (ref 3.5–5.3)
Sodium: 140 mmol/L (ref 135–146)
eGFR: 82 mL/min/{1.73_m2} (ref >=60)

## 2024-02-08 ENCOUNTER — Other Ambulatory Visit: Payer: Self-pay

## 2024-02-08 ENCOUNTER — Other Ambulatory Visit: Payer: Self-pay | Admitting: Pharmacist

## 2024-02-08 DIAGNOSIS — Z7251 High risk heterosexual behavior: Secondary | ICD-10-CM

## 2024-02-08 MED ORDER — EMTRICITABINE-TENOFOVIR DF 200-300 MG PO TABS
1.0000 | ORAL_TABLET | Freq: Every day | ORAL | 5 refills | Status: DC
  Filled 2024-02-08 – 2024-02-10 (×2): qty 30, 30d supply, fill #0
  Filled 2024-03-09: qty 30, 30d supply, fill #1
  Filled 2024-04-03: qty 30, 30d supply, fill #2
  Filled 2024-04-25 (×2): qty 30, 30d supply, fill #3
  Filled 2024-05-31 – 2024-06-04 (×2): qty 30, 30d supply, fill #4
  Filled 2024-07-03: qty 30, 30d supply, fill #5

## 2024-02-08 NOTE — Progress Notes (Signed)
 Specialty Pharmacy Ongoing Clinical Assessment Note  Jocelyn Foster is a 53 y.o. female who is being followed by the specialty pharmacy service for RxSp HIV PrEP   Patient's specialty medication(s) reviewed today: Emtricitabine-Tenofovir DF (TRUVADA)   Missed doses in the last 4 weeks: 0   Patient/Caregiver did not have any additional questions or concerns.   Therapeutic benefit summary: Patient is achieving benefit   Adverse events/side effects summary: No adverse events/side effects   Patient's therapy is appropriate to: Continue    Goals Addressed   None     Follow up:  6 months  [facility] Specialty Pharmacist

## 2024-02-10 ENCOUNTER — Other Ambulatory Visit: Payer: Self-pay | Admitting: Pharmacy Technician

## 2024-02-10 ENCOUNTER — Other Ambulatory Visit: Payer: Self-pay

## 2024-02-10 NOTE — Progress Notes (Signed)
 Specialty Pharmacy Refill Coordination Note  Jennifer Payes is a 53 y.o. female contacted today regarding refills of specialty medication(s) Emtricitabine-Tenofovir DF (TRUVADA)   Patient requested Delivery   Delivery date: 02/14/24   Verified address: [address] [place] [address]   Medication will be filled on 0414/25.

## 2024-03-06 ENCOUNTER — Other Ambulatory Visit: Payer: Self-pay

## 2024-03-09 ENCOUNTER — Other Ambulatory Visit: Payer: Self-pay | Admitting: Pharmacy Technician

## 2024-03-09 ENCOUNTER — Other Ambulatory Visit: Payer: Self-pay

## 2024-03-09 NOTE — Progress Notes (Signed)
 Specialty Pharmacy Refill Coordination Note  Jocelyn Foster is a 53 y.o. female contacted today regarding refills of specialty medication(s) Emtricitabine -Tenofovir  DF (TRUVADA )   Patient requested Delivery   Delivery date: 03/13/24   Verified address: [address] [name], [place] [address]   Medication will be filled on 03/12/24.

## 2024-03-30 ENCOUNTER — Other Ambulatory Visit (HOSPITAL_COMMUNITY): Payer: Self-pay

## 2024-04-03 ENCOUNTER — Other Ambulatory Visit: Payer: Self-pay

## 2024-04-03 NOTE — Progress Notes (Signed)
 Specialty Pharmacy Refill Coordination Note  Marg Macmaster is a 53 y.o. female contacted today regarding refills of specialty medication(s) Emtricitabine -Tenofovir  DF (TRUVADA )   Patient requested Delivery   Delivery date: 04/06/24   Verified address: [address] [name], [place] [address]   Medication will be filled on 04/05/24.

## 2024-04-25 ENCOUNTER — Other Ambulatory Visit: Payer: Self-pay

## 2024-04-25 ENCOUNTER — Encounter (INDEPENDENT_AMBULATORY_CARE_PROVIDER_SITE_OTHER): Payer: Self-pay

## 2024-04-25 NOTE — Progress Notes (Signed)
 Specialty Pharmacy Refill Coordination Note  Jocelyn Foster is a 53 y.o. female contacted today regarding refills of specialty medication(s) Emtricitabine -Tenofovir  DF (TRUVADA )   Patient requested Delivery   Delivery date: 05/08/24   Verified address: [address]  [place] [address]   Medication will be filled on 05/07/24.

## 2024-04-25 NOTE — Progress Notes (Signed)
 Patient Satisfaction Survey Complete.

## 2024-05-25 ENCOUNTER — Other Ambulatory Visit (HOSPITAL_COMMUNITY): Payer: Self-pay

## 2024-05-29 ENCOUNTER — Other Ambulatory Visit (HOSPITAL_COMMUNITY): Payer: Self-pay

## 2024-05-31 ENCOUNTER — Other Ambulatory Visit: Payer: Self-pay

## 2024-06-04 ENCOUNTER — Other Ambulatory Visit: Payer: Self-pay

## 2024-06-04 NOTE — Progress Notes (Signed)
 Specialty Pharmacy Refill Coordination Note  Jocelyn Foster is a 53 y.o. female contacted today regarding refills of specialty medication(s) Emtricitabine -Tenofovir  DF (TRUVADA )   Patient requested Delivery   Delivery date: 06/06/24   Verified address: [address]  [place] [address]   Medication will be filled on 06/05/24.

## 2024-06-05 ENCOUNTER — Other Ambulatory Visit: Payer: Self-pay

## 2024-06-15 ENCOUNTER — Encounter: Payer: Self-pay | Admitting: Pharmacist

## 2024-07-03 ENCOUNTER — Other Ambulatory Visit: Payer: Self-pay

## 2024-07-05 ENCOUNTER — Other Ambulatory Visit: Payer: Self-pay

## 2024-07-05 NOTE — Progress Notes (Signed)
 Specialty Pharmacy Refill Coordination Note  Jocelyn Foster is a 53 y.o. female contacted today regarding refills of specialty medication(s) Emtricitabine -Tenofovir  DF (TRUVADA )   Patient requested Delivery   Delivery date: 07/09/24   Verified address: [address]  [place] [address]   Medication will be filled on 09.05.25.

## 2024-07-30 ENCOUNTER — Other Ambulatory Visit: Payer: Self-pay

## 2024-07-31 ENCOUNTER — Other Ambulatory Visit: Payer: Self-pay

## 2024-07-31 ENCOUNTER — Other Ambulatory Visit: Payer: Self-pay | Admitting: Pharmacist

## 2024-07-31 DIAGNOSIS — Z7251 High risk heterosexual behavior: Secondary | ICD-10-CM

## 2024-07-31 NOTE — Progress Notes (Signed)
 Specialty Pharmacy Refill Coordination Note  Jocelyn Foster is a 53 y.o. female contacted today regarding refills of specialty medication(s) Emtricitabine -Tenofovir  DF (TRUVADA )   Patient requested No data recorded  Delivery date: 08/03/24   Verified address: [address]  [place] [address]   Medication will be filled on 08/02/24.   This fill date is pending response to refill request from provider. Patient is aware and if they have not received fill by intended date they must follow up with pharmacy.

## 2024-08-01 ENCOUNTER — Other Ambulatory Visit (HOSPITAL_COMMUNITY): Payer: Self-pay

## 2024-08-03 NOTE — Progress Notes (Deleted)
   HPI: Jocelyn Foster is a 53 y.o. female who presents to the [facility] pharmacy clinic for HIV PrEP follow-up.  Insured   []    Uninsured  []    Patient Active Problem List   Diagnosis Date Noted   Cellulitis of arm 10/20/2021   Cellulitis of finger of left hand 10/20/2021   High risk sexual behavior 02/17/2021    Patient's Medications  New Prescriptions   No medications on file  Previous Medications   ARMOUR THYROID  30 MG TABLET    Take 30 mg by mouth every morning.   EMTRICITABINE -TENOFOVIR  (TRUVADA ) 200-300 MG TABLET    Take 1 tablet by mouth daily.   LIOTHYRONINE  (CYTOMEL ) 5 MCG TABLET    Take 5-10 mcg by mouth daily.  Modified Medications   No medications on file  Discontinued Medications   No medications on file        No data to display          Labs:  SCr: Lab Results  Component Value Date   CREATININE 0.85 02/06/2024   CREATININE 0.87 08/08/2023   CREATININE 0.94 02/03/2023   CREATININE 0.88 08/03/2022   CREATININE 0.82 02/09/2022   HIV Lab Results  Component Value Date   HIV NON-REACTIVE 02/06/2024   HIV NON-REACTIVE 08/08/2023   HIV NON-REACTIVE 02/03/2023   HIV NON-REACTIVE 08/03/2022   HIV NON-REACTIVE 02/09/2022   Hepatitis B No results found for: HEPBSAB, HEPBSAG, HEPBCAB Hepatitis C No results found for: HEPCAB, HCVRNAPCRQN Hepatitis A No results found for: HAV RPR and STI Lab Results  Component Value Date   LABRPR NON REACTIVE 10/23/2021        No data to display          Assessment: ***  Plan: ***  Mekhi Sonn L. Everet Flagg, PharmD, BCIDP, AAHIVP, CPP Clinical Pharmacist Practitioner - Infectious Diseases Clinical Pharmacist Lead - Specialty Pharmacy [facility]

## 2024-08-06 ENCOUNTER — Other Ambulatory Visit: Payer: Self-pay

## 2024-08-06 ENCOUNTER — Encounter: Payer: Self-pay | Admitting: Pharmacist

## 2024-08-06 ENCOUNTER — Ambulatory Visit: Payer: PRIVATE HEALTH INSURANCE | Admitting: Pharmacist

## 2024-08-06 DIAGNOSIS — Z79899 Other long term (current) drug therapy: Secondary | ICD-10-CM

## 2024-08-06 NOTE — Progress Notes (Signed)
 Refills denied. Patient cancelled today's appointment, and is tentatively re-scheduled for 10/8. Refills will be sent after appointment.

## 2024-08-07 NOTE — Progress Notes (Unsigned)
   HPI: Jocelyn Foster is a 53 y.o. female who presents to the [facility] pharmacy clinic for HIV PrEP follow-up.  Referring ID Physician: Dr. Overton   Patient Active Problem List   Diagnosis Date Noted   Cellulitis of arm 10/20/2021   Cellulitis of finger of left hand 10/20/2021   High risk sexual behavior 02/17/2021    Patient's Medications  New Prescriptions   No medications on file  Previous Medications   ARMOUR THYROID  30 MG TABLET    Take 30 mg by mouth every morning.   EMTRICITABINE -TENOFOVIR  (TRUVADA ) 200-300 MG TABLET    Take 1 tablet by mouth daily.   LIOTHYRONINE  (CYTOMEL ) 5 MCG TABLET    Take 5-10 mcg by mouth daily.  Modified Medications   No medications on file  Discontinued Medications   No medications on file        No data to display          Labs:  SCr: Lab Results  Component Value Date   CREATININE 0.85 02/06/2024   CREATININE 0.87 08/08/2023   CREATININE 0.94 02/03/2023   CREATININE 0.88 08/03/2022   CREATININE 0.82 02/09/2022   HIV Lab Results  Component Value Date   HIV NON-REACTIVE 02/06/2024   HIV NON-REACTIVE 08/08/2023   HIV NON-REACTIVE 02/03/2023   HIV NON-REACTIVE 08/03/2022   HIV NON-REACTIVE 02/09/2022   Hepatitis B No results found for: HEPBSAB, HEPBSAG, HEPBCAB Hepatitis C No results found for: HEPCAB, HCVRNAPCRQN Hepatitis A No results found for: HAV RPR and STI Lab Results  Component Value Date   LABRPR NON REACTIVE 10/23/2021        No data to display          Assessment: Jocelyn Foster presents today for HIV PrEP follow up. She continues to take Truvada  daily without any issues or missed doses. Her HIV positive husband is doing well on his ART. Screened for acute HIV symptoms such as fatigue, muscle aches, rash, sore throat, lymphadenopathy, headache, night sweats, nausea/vomiting/diarrhea, and fever. Denies any symptoms. Will see back in 6 months.  Plan: - HIV ab and BMP today - Truvada  x 6 months if  HIV negative  - Follow up with me again in April  Itay Mella L. Camryn Quesinberry, PharmD, BCIDP, AAHIVP, CPP Clinical Pharmacist Practitioner - Infectious Diseases Clinical Pharmacist Lead - Specialty Pharmacy [facility]

## 2024-08-08 ENCOUNTER — Other Ambulatory Visit: Payer: Self-pay

## 2024-08-08 ENCOUNTER — Ambulatory Visit (INDEPENDENT_AMBULATORY_CARE_PROVIDER_SITE_OTHER): Payer: PRIVATE HEALTH INSURANCE | Admitting: Pharmacist

## 2024-08-08 DIAGNOSIS — Z79899 Other long term (current) drug therapy: Secondary | ICD-10-CM

## 2024-08-09 ENCOUNTER — Other Ambulatory Visit: Payer: Self-pay

## 2024-08-09 ENCOUNTER — Other Ambulatory Visit: Payer: Self-pay | Admitting: Pharmacist

## 2024-08-09 DIAGNOSIS — Z7251 High risk heterosexual behavior: Secondary | ICD-10-CM

## 2024-08-09 LAB — HIV ANTIBODY (ROUTINE TESTING W REFLEX)
HIV 1&2 Ab, 4th Generation: NONREACTIVE
HIV FINAL INTERPRETATION: NEGATIVE

## 2024-08-09 LAB — BASIC METABOLIC PANEL WITH GFR
BUN: 18 mg/dL (ref 7–25)
CO2: 28 mmol/L (ref 20–32)
Calcium: 9.3 mg/dL (ref 8.6–10.4)
Chloride: 105 mmol/L (ref 98–110)
Creat: 0.89 mg/dL (ref 0.50–1.03)
Glucose, Bld: 87 mg/dL (ref 65–99)
Potassium: 4.6 mmol/L (ref 3.5–5.3)
Sodium: 139 mmol/L (ref 135–146)
eGFR: 78 mL/min/1.73m2 (ref >=60)

## 2024-08-09 MED ORDER — EMTRICITABINE-TENOFOVIR DF 200-300 MG PO TABS
1.0000 | ORAL_TABLET | Freq: Every day | ORAL | 5 refills | Status: AC
  Filled 2024-08-09: qty 30, 30d supply, fill #0
  Filled 2024-08-31 – 2024-09-03 (×2): qty 30, 30d supply, fill #1
  Filled 2024-10-02: qty 30, 30d supply, fill #2
  Filled 2024-10-30 – 2024-10-31 (×2): qty 30, 30d supply, fill #3
  Filled 2024-11-29 – 2024-11-30 (×2): qty 30, 30d supply, fill #4

## 2024-08-10 ENCOUNTER — Other Ambulatory Visit: Payer: Self-pay

## 2024-08-31 ENCOUNTER — Other Ambulatory Visit: Payer: Self-pay

## 2024-09-03 ENCOUNTER — Encounter (INDEPENDENT_AMBULATORY_CARE_PROVIDER_SITE_OTHER): Payer: Self-pay

## 2024-09-03 ENCOUNTER — Other Ambulatory Visit (HOSPITAL_COMMUNITY): Payer: Self-pay

## 2024-09-04 ENCOUNTER — Other Ambulatory Visit: Payer: Self-pay

## 2024-09-04 ENCOUNTER — Other Ambulatory Visit: Payer: Self-pay | Admitting: Pharmacy Technician

## 2024-09-04 NOTE — Progress Notes (Signed)
 Specialty Pharmacy Refill Coordination Note  Jocelyn Foster is a 53 y.o. female contacted today regarding refills of specialty medication(s) Emtricitabine -Tenofovir  DF (TRUVADA )   Patient requested (Patient-Rptd) Delivery   Delivery date: 09/07/2024 Verified address: (Patient-Rptd) [address] [name] [address]   Medication will be filled on: 09/06/2024

## 2024-09-05 ENCOUNTER — Other Ambulatory Visit: Payer: Self-pay

## 2024-09-28 ENCOUNTER — Other Ambulatory Visit (HOSPITAL_COMMUNITY): Payer: Self-pay

## 2024-10-02 ENCOUNTER — Other Ambulatory Visit (HOSPITAL_COMMUNITY): Payer: Self-pay

## 2024-10-04 ENCOUNTER — Other Ambulatory Visit: Payer: Self-pay

## 2024-10-04 NOTE — Progress Notes (Signed)
 Specialty Pharmacy Refill Coordination Note  Jocelyn Foster is a 53 y.o. female contacted today regarding refills of specialty medication(s) Emtricitabine -Tenofovir  DF (TRUVADA )   Patient requested Delivery   Delivery date: 10/09/24   Verified address: [address] [place], [place], [address]   Medication will be filled on: 10/08/24

## 2024-10-08 ENCOUNTER — Other Ambulatory Visit: Payer: Self-pay

## 2024-10-30 ENCOUNTER — Other Ambulatory Visit (HOSPITAL_COMMUNITY): Payer: Self-pay

## 2024-10-31 ENCOUNTER — Other Ambulatory Visit: Payer: Self-pay

## 2024-11-02 ENCOUNTER — Other Ambulatory Visit: Payer: Self-pay

## 2024-11-02 NOTE — Progress Notes (Signed)
 Specialty Pharmacy Refill Coordination Note  Jocelyn Foster is a 54 y.o. female contacted today regarding refills of specialty medication(s) Emtricitabine -Tenofovir  DF (TRUVADA )   Patient requested Delivery   Delivery date: 11/07/24   Verified address: [address] [place], [place], [address]   Medication will be filled on: 11/06/24

## 2024-11-09 ENCOUNTER — Other Ambulatory Visit: Payer: Self-pay

## 2024-11-09 ENCOUNTER — Other Ambulatory Visit: Payer: Self-pay | Admitting: Obstetrics and Gynecology

## 2024-11-09 ENCOUNTER — Other Ambulatory Visit: Payer: Self-pay | Admitting: Pharmacist

## 2024-11-09 DIAGNOSIS — Z1231 Encounter for screening mammogram for malignant neoplasm of breast: Secondary | ICD-10-CM

## 2024-11-09 NOTE — Progress Notes (Signed)
 Specialty Pharmacy Ongoing Clinical Assessment Note  Jocelyn Foster is a 54 y.o. female who is being followed by the specialty pharmacy service for RxSp HIV PrEP   Patient's specialty medication(s) reviewed today: Emtricitabine -Tenofovir  DF (TRUVADA )   Missed doses in the last 4 weeks: 0   Patient/Caregiver did not have any additional questions or concerns.   Therapeutic benefit summary: Patient is achieving benefit   Adverse events/side effects summary: No adverse events/side effects   Patient's therapy is appropriate to: Continue    Goals Addressed             This Visit's Progress    Maintain optimal adherence to therapy   On track    Patient is on track. Patient will maintain adherence.         Follow up: 12 months  Lyle LELON Chalk Specialty Pharmacist

## 2024-11-15 ENCOUNTER — Encounter: Payer: Self-pay | Admitting: Pharmacist

## 2024-11-16 NOTE — Telephone Encounter (Signed)
 Jocelyn Foster - let's discuss this on Monday!

## 2024-11-20 ENCOUNTER — Ambulatory Visit: Payer: PRIVATE HEALTH INSURANCE

## 2024-11-21 NOTE — Progress Notes (Signed)
 1/21 Attempted to call patient to discuss lab results. No answer; left voicemail instructing to call us  back to discuss lab results.   1/22 Jon called back after MyChart message sent to inform her of results. She wanted to confirm that continuing to take Truvada  was ok, that it was likely unrelated to the hematuria, and that there wasn't anything she should be doing differently. I confirmed continuing Truvada  is ok, likely unrelated to the hematuria, and nothing to differently at this time. Repeat urinalysis will help guide further workup if necessary for the hematuria. She expresses understanding.   Maurilio Patten, PharmD PGY1 Pharmacy Resident [facility] 11/22/2024 10:08 AM

## 2024-11-29 ENCOUNTER — Other Ambulatory Visit: Payer: Self-pay

## 2024-11-30 ENCOUNTER — Other Ambulatory Visit: Payer: Self-pay

## 2024-12-04 ENCOUNTER — Other Ambulatory Visit: Payer: Self-pay

## 2024-12-04 ENCOUNTER — Ambulatory Visit
Admission: RE | Admit: 2024-12-04 | Discharge: 2024-12-04 | Disposition: A | Payer: PRIVATE HEALTH INSURANCE | Source: Ambulatory Visit | Attending: Obstetrics and Gynecology | Admitting: Obstetrics and Gynecology

## 2024-12-04 ENCOUNTER — Other Ambulatory Visit (HOSPITAL_COMMUNITY): Payer: Self-pay

## 2024-12-04 DIAGNOSIS — Z1231 Encounter for screening mammogram for malignant neoplasm of breast: Secondary | ICD-10-CM

## 2024-12-05 ENCOUNTER — Other Ambulatory Visit: Payer: Self-pay

## 2025-02-06 ENCOUNTER — Ambulatory Visit: Payer: PRIVATE HEALTH INSURANCE | Admitting: Pharmacist
